# Patient Record
Sex: Female | Born: 1980 | Race: White | Hispanic: No | Marital: Married | Smoking: Never smoker
Health system: Southern US, Community
[De-identification: ages and names within clinical notes are randomized; demographics above are authoritative.]

## PROBLEM LIST (undated history)

## (undated) DIAGNOSIS — J45909 Unspecified asthma, uncomplicated: Secondary | ICD-10-CM

## (undated) DIAGNOSIS — R87629 Unspecified abnormal cytological findings in specimens from vagina: Secondary | ICD-10-CM

## (undated) DIAGNOSIS — F419 Anxiety disorder, unspecified: Secondary | ICD-10-CM

## (undated) DIAGNOSIS — K5090 Crohn's disease, unspecified, without complications: Secondary | ICD-10-CM

## (undated) DIAGNOSIS — K432 Incisional hernia without obstruction or gangrene: Secondary | ICD-10-CM

## (undated) DIAGNOSIS — L309 Dermatitis, unspecified: Secondary | ICD-10-CM

## (undated) HISTORY — PX: OSTOMY: SHX5997

## (undated) HISTORY — PX: HERNIA REPAIR: SHX51

---

## 1999-04-04 HISTORY — PX: SUBTOTAL COLECTOMY: SHX855

## 2010-04-24 ENCOUNTER — Encounter: Payer: Self-pay | Admitting: Gastroenterology

## 2010-05-04 ENCOUNTER — Other Ambulatory Visit (HOSPITAL_COMMUNITY)
Admission: RE | Admit: 2010-05-04 | Discharge: 2010-05-04 | Disposition: A | Discharge status: Home / Self Care | Payer: BC Managed Care – HMO | Source: Ambulatory Visit | Attending: Family Medicine | Admitting: Family Medicine

## 2010-05-04 DIAGNOSIS — Z0000 Encounter for general adult medical examination without abnormal findings: Secondary | ICD-10-CM | POA: Insufficient documentation

## 2012-02-12 ENCOUNTER — Observation Stay (HOSPITAL_BASED_OUTPATIENT_CLINIC_OR_DEPARTMENT_OTHER)
Admission: EM | Admit: 2012-02-12 | Discharge: 2012-02-15 | Disposition: A | Discharge status: Home / Self Care | Payer: BC Managed Care – PPO | Attending: General Surgery | Admitting: General Surgery

## 2012-02-12 ENCOUNTER — Emergency Department (HOSPITAL_BASED_OUTPATIENT_CLINIC_OR_DEPARTMENT_OTHER): Payer: BC Managed Care – PPO

## 2012-02-12 ENCOUNTER — Encounter (HOSPITAL_BASED_OUTPATIENT_CLINIC_OR_DEPARTMENT_OTHER): Payer: Self-pay | Admitting: Family Medicine

## 2012-02-12 DIAGNOSIS — K430 Incisional hernia with obstruction, without gangrene: Principal | ICD-10-CM | POA: Insufficient documentation

## 2012-02-12 DIAGNOSIS — K460 Unspecified abdominal hernia with obstruction, without gangrene: Secondary | ICD-10-CM

## 2012-02-12 DIAGNOSIS — Z9049 Acquired absence of other specified parts of digestive tract: Secondary | ICD-10-CM | POA: Insufficient documentation

## 2012-02-12 DIAGNOSIS — K5010 Crohn's disease of large intestine without complications: Secondary | ICD-10-CM | POA: Diagnosis present

## 2012-02-12 DIAGNOSIS — K432 Incisional hernia without obstruction or gangrene: Secondary | ICD-10-CM | POA: Diagnosis present

## 2012-02-12 DIAGNOSIS — Z932 Ileostomy status: Secondary | ICD-10-CM

## 2012-02-12 DIAGNOSIS — N808 Other endometriosis: Secondary | ICD-10-CM | POA: Insufficient documentation

## 2012-02-12 DIAGNOSIS — Z5331 Laparoscopic surgical procedure converted to open procedure: Secondary | ICD-10-CM | POA: Insufficient documentation

## 2012-02-12 HISTORY — DX: Anxiety disorder, unspecified: F41.9

## 2012-02-12 HISTORY — DX: Crohn's disease, unspecified, without complications: K50.90

## 2012-02-12 LAB — BASIC METABOLIC PANEL
BUN: 9 mg/dL (ref 6–23)
CO2: 28 mEq/L (ref 19–32)
Calcium: 9.8 mg/dL (ref 8.4–10.5)
Glucose, Bld: 88 mg/dL (ref 70–99)
Sodium: 137 mEq/L (ref 135–145)

## 2012-02-12 LAB — CBC WITH DIFFERENTIAL/PLATELET
Eosinophils Relative: 5 % (ref 0–5)
HCT: 41 % (ref 36.0–46.0)
Hemoglobin: 14.1 g/dL (ref 12.0–15.0)
Lymphocytes Relative: 20 % (ref 12–46)
Lymphs Abs: 2.2 10*3/uL (ref 0.7–4.0)
MCH: 29.1 pg (ref 26.0–34.0)
MCV: 84.7 fL (ref 78.0–100.0)
Monocytes Relative: 7 % (ref 3–12)
Platelets: 326 10*3/uL (ref 150–400)
RBC: 4.84 MIL/uL (ref 3.87–5.11)
WBC: 10.7 10*3/uL — ABNORMAL HIGH (ref 4.0–10.5)

## 2012-02-12 MED ORDER — IOHEXOL 300 MG/ML  SOLN
100.0000 mL | Freq: Once | INTRAMUSCULAR | Status: AC | PRN
  Administered 2012-02-12: 100 mL via INTRAVENOUS

## 2012-02-12 MED ORDER — IOHEXOL 300 MG/ML  SOLN: 25.0000 mL | INTRAMUSCULAR | Status: AC

## 2012-02-12 NOTE — ED Provider Notes (Signed)
History  This chart was scribed for Rolan Bucco, MD by Ardeen Jourdain, ED Scribe. This patient was seen in room MH12/MH12 and the patient's care was started at 1948.  CSN: [id number]  Arrival date & time 02/12/12  1749   First MD Initiated Contact with Patient 02/12/12 1948      Chief Complaint  Patient presents with  . Hernia     The history is provided by the patient. No language interpreter was used.    Sandra Love is a 31 y.o. female who presents to the Emergency Department complaining of a hernia to her RLQ with associated gradually worsening pain and warmth in the area. She denies fever, emesis, changes in appetite, diarrhea, constipation or the chance of pregnancy. She reports that the symptoms started a month ago.  She states that she saw her PCP who could not push the hernia back in and reccommended she get a CT.She has a hx of Crohn's dz s/p colon resection and ostomy placement.  She subsequently developed an incisional hernia which has been repaired twice to this area.  She has had the hernia recur, but is usually able to reduce it on her own.  Over the last 4 weeks, has had the hernia in the same place, but has not been able to reduce it.  Says that it has been tender to the area, is mildly increasing.   Past Medical History  Diagnosis Date  . Crohn disease     Past Surgical History  Procedure Date  . Hernia repair   . Colon resection   . Colon surgery     No family history on file.  History  Substance Use Topics  . Smoking status: Never Smoker   . Smokeless tobacco: Not on file  . Alcohol Use: Yes   No OB history available.   Review of Systems  Constitutional: Negative for fever, chills, diaphoresis and fatigue.  HENT: Negative for congestion, rhinorrhea and sneezing.   Eyes: Negative.   Respiratory: Negative for cough, chest tightness and shortness of breath.   Cardiovascular: Negative for chest pain and leg swelling.  Gastrointestinal: Positive for  abdominal pain. Negative for nausea, vomiting, diarrhea and blood in stool.  Genitourinary: Negative for frequency, hematuria, flank pain and difficulty urinating.  Musculoskeletal: Negative for back pain and arthralgias.  Skin: Negative for rash.  Neurological: Negative for dizziness, speech difficulty, weakness, numbness and headaches.  All other systems reviewed and are negative.    Allergies  Penicillins  Home Medications   Current Outpatient Rx  Name  Route  Sig  Dispense  Refill  . ALLEGRA PO   Oral   Take by mouth.           Triage Vitals: BP 119/71  Pulse 76  Temp 98.3 F (36.8 C) (Oral)  Resp 16  Ht 5' 3.5" (1.613 m)  Wt 128 lb (58.06 kg)  BMI 22.32 kg/m2  SpO2 100%  LMP 02/01/2012  Physical Exam  Nursing note and vitals reviewed. Constitutional: She is oriented to person, place, and time. She appears well-developed and well-nourished.  HENT:  Head: Normocephalic and atraumatic.  Eyes: Pupils are equal, round, and reactive to light.  Neck: Normal range of motion. Neck supple.  Cardiovascular: Normal rate, regular rhythm and normal heart sounds.   Pulmonary/Chest: Effort normal and breath sounds normal. No respiratory distress. She has no wheezes. She has no rales. She exhibits no tenderness.  Abdominal: Soft. Bowel sounds are normal. There is tenderness. There  is no rebound and no guarding.       Pt has a scar to right lower abdomen with a firm/tender mass about 2cm in diameter.  No surrounding abdominal tenderness.  +colostomy to left abdomen.  Musculoskeletal: Normal range of motion. She exhibits no edema.  Lymphadenopathy:    She has no cervical adenopathy.  Neurological: She is alert and oriented to person, place, and time.  Skin: Skin is warm and dry. No rash noted.  Psychiatric: She has a normal mood and affect.    ED Course  Procedures (including critical care time)  DIAGNOSTIC STUDIES: Oxygen Saturation is 100% on room air, normal by my  interpretation.    COORDINATION OF CARE:  7:57 PM: Discussed treatment plan which includes consultation with the GI surgeon with pt at bedside and pt agreed to plan.   Results for orders placed during the hospital encounter of 02/12/12  CBC WITH DIFFERENTIAL      Component Value Range   WBC 10.7 (*) 4.0 - 10.5 K/uL   RBC 4.84  3.87 - 5.11 MIL/uL   Hemoglobin 14.1  12.0 - 15.0 g/dL   HCT 65.4  65.0 - 35.4 %   MCV 84.7  78.0 - 100.0 fL   MCH 29.1  26.0 - 34.0 pg   MCHC 34.4  30.0 - 36.0 g/dL   RDW 65.6  81.2 - 75.1 %   Platelets 326  150 - 400 K/uL   Neutrophils Relative 68  43 - 77 %   Neutro Abs 7.3  1.7 - 7.7 K/uL   Lymphocytes Relative 20  12 - 46 %   Lymphs Abs 2.2  0.7 - 4.0 K/uL   Monocytes Relative 7  3 - 12 %   Monocytes Absolute 0.7  0.1 - 1.0 K/uL   Eosinophils Relative 5  0 - 5 %   Eosinophils Absolute 0.5  0.0 - 0.7 K/uL   Basophils Relative 0  0 - 1 %   Basophils Absolute 0.0  0.0 - 0.1 K/uL  BASIC METABOLIC PANEL      Component Value Range   Sodium 137  135 - 145 mEq/L   Potassium 3.9  3.5 - 5.1 mEq/L   Chloride 99  96 - 112 mEq/L   CO2 28  19 - 32 mEq/L   Glucose, Bld 88  70 - 99 mg/dL   BUN 9  6 - 23 mg/dL   Creatinine, Ser 7.00  0.50 - 1.10 mg/dL   Calcium 9.8  8.4 - 17.4 mg/dL   GFR calc non Af Amer >90  >90 mL/min   GFR calc Af Amer >90  >90 mL/min   Ct Abdomen Pelvis W Contrast  02/12/2012  *RADIOLOGY REPORT*  Clinical Data: Possible right inguinal hernia.  CT ABDOMEN AND PELVIS WITH CONTRAST  Technique:  Multidetector CT imaging of the abdomen and pelvis was performed following the standard protocol during bolus administration of intravenous contrast.  Contrast: OMNIPAQUE IOHEXOL 300 MG/ML  SOLN  Comparison: None.  Findings: No confluent opacities in the lung bases.  Heart is normal size.  No effusions.  Multiple gallstones within the gallbladder.  No biliary ductal dilatation.  No focal abnormality within the liver, spleen, pancreas, adrenals or  kidneys.  There is wall thickening within the rectum suggests the possibility of proctitis.  Recommend clinical correlation.  Uterus is retroverted, grossly unremarkable.  No adnexal masses or free fluid.  There is a left lower quadrant ostomy.  Prior subtotal colectomy.  There is a surgical scar noted in the right lower pelvic wall. There is a small hernia associated with this scar.  Question old stoma. This contains a small bowel loop.  No evidence of bowel obstruction.  No inguinal hernia.  Small bowel is decompressed and grossly unremarkable.  No acute bony abnormality.  IMPRESSION: Changes of prior subtotal colectomy.  The remaining rectum appears thick-walled, question proctitis.  Small hernia at the base scar in the right lower pelvic wall, likely had a prior ostomy site.  This contains a small bowel loop. No evidence of bowel obstruction.  Cholelithiasis.   Original Report Authenticated By: Charlett Nose, M.D.       1. Incarcerated hernia       MDM  On initial evaluation, pt with incisional hernia that has been present for 4 weeks.  I attempted to reduce, but was unable.  Pt was markedly tender with reduction attempts.  Is otherwise well appearing.  No vomiting, no fever.  I discussed with Dr Michaell Cowing who recommended going on with CT scan which was performed.  This showed a small loop of bowel in the incisional hernia.  I discussed again with Dr Michaell Cowing who recommended pt be transferred for surgical evaluation.  Discussed with Dr Cyndie Mull.  Pt denies need for pain meds.      I personally performed the services described in this documentation, which was scribed in my presence.  The recorded information has been reviewed and considered.    Rolan Bucco, MD 02/12/12 [phone]

## 2012-02-12 NOTE — ED Notes (Signed)
Pt. Aware of what the plan of care is.

## 2012-02-12 NOTE — ED Notes (Signed)
Pt sts she went to PCP today for incarcerated hermia to RLQ and PCP was unable to "push it in". Pt here for RLQ abdominal pain and sts PCP advised getting a CT.

## 2012-02-12 NOTE — ED Notes (Signed)
MD at bedside. 

## 2012-02-12 NOTE — ED Notes (Signed)
Family at bedside. 

## 2012-02-12 NOTE — ED Notes (Signed)
Pt. Reports she has an illiostomy since age 31 due to colon removed.

## 2012-02-13 ENCOUNTER — Observation Stay (HOSPITAL_COMMUNITY): Payer: BC Managed Care – PPO | Admitting: Anesthesiology

## 2012-02-13 ENCOUNTER — Encounter (HOSPITAL_COMMUNITY): Payer: Self-pay | Admitting: Surgery

## 2012-02-13 ENCOUNTER — Encounter (HOSPITAL_COMMUNITY): Admission: EM | Disposition: A | Discharge status: Home / Self Care | Payer: Self-pay | Source: Home / Self Care

## 2012-02-13 ENCOUNTER — Encounter (HOSPITAL_COMMUNITY): Payer: Self-pay | Admitting: Anesthesiology

## 2012-02-13 DIAGNOSIS — K432 Incisional hernia without obstruction or gangrene: Secondary | ICD-10-CM | POA: Diagnosis present

## 2012-02-13 DIAGNOSIS — Z932 Ileostomy status: Secondary | ICD-10-CM

## 2012-02-13 DIAGNOSIS — K5010 Crohn's disease of large intestine without complications: Secondary | ICD-10-CM | POA: Diagnosis present

## 2012-02-13 DIAGNOSIS — F419 Anxiety disorder, unspecified: Secondary | ICD-10-CM | POA: Insufficient documentation

## 2012-02-13 HISTORY — PX: LAPAROSCOPY: SHX197

## 2012-02-13 HISTORY — PX: INCISIONAL HERNIA REPAIR: SHX193

## 2012-02-13 HISTORY — PX: INSERTION OF MESH: SHX5868

## 2012-02-13 HISTORY — DX: Incisional hernia without obstruction or gangrene: K43.2

## 2012-02-13 SURGERY — INSERTION OF MESH
Anesthesia: General | Site: Abdomen | Wound class: Clean

## 2012-02-13 MED ORDER — HYDROMORPHONE HCL PF 1 MG/ML IJ SOLN
0.5000 mg | INTRAMUSCULAR | Status: DC | PRN
  Administered 2012-02-13 – 2012-02-15 (×12): 1 mg via INTRAVENOUS
  Filled 2012-02-13 (×12): qty 1

## 2012-02-13 MED ORDER — LORAZEPAM 2 MG/ML IJ SOLN: 0.5000 mg | Freq: Three times a day (TID) | INTRAMUSCULAR | Status: DC | PRN

## 2012-02-13 MED ORDER — OXYCODONE HCL 5 MG PO TABS
5.0000 mg | ORAL_TABLET | ORAL | Status: DC | PRN
  Administered 2012-02-14 – 2012-02-15 (×3): 5 mg via ORAL
  Administered 2012-02-15: 10 mg via ORAL
  Filled 2012-02-13 (×2): qty 1
  Filled 2012-02-13: qty 2
  Filled 2012-02-13: qty 1

## 2012-02-13 MED ORDER — FENTANYL CITRATE 0.05 MG/ML IJ SOLN
INTRAMUSCULAR | Status: DC | PRN
  Administered 2012-02-13 (×5): 50 ug via INTRAVENOUS

## 2012-02-13 MED ORDER — CISATRACURIUM BESYLATE (PF) 10 MG/5ML IV SOLN
INTRAVENOUS | Status: DC | PRN
  Administered 2012-02-13: 5 mg via INTRAVENOUS
  Administered 2012-02-13 (×3): 1 mg via INTRAVENOUS

## 2012-02-13 MED ORDER — PHENOL 1.4 % MT LIQD: 2.0000 | OROMUCOSAL | Status: DC | PRN

## 2012-02-13 MED ORDER — LACTATED RINGERS IR SOLN
Status: DC | PRN
  Administered 2012-02-13: 3000 mL

## 2012-02-13 MED ORDER — GENTAMICIN SULFATE 40 MG/ML IJ SOLN
280.0000 mg | Freq: Once | INTRAVENOUS | Status: DC
  Filled 2012-02-13: qty 7

## 2012-02-13 MED ORDER — GENTAMICIN IN SALINE 1-0.9 MG/ML-% IV SOLN: 100.0000 mg | INTRAVENOUS | Status: DC

## 2012-02-13 MED ORDER — ACETAMINOPHEN 10 MG/ML IV SOLN
INTRAVENOUS | Status: DC | PRN
  Administered 2012-02-13: 1000 mg via INTRAVENOUS

## 2012-02-13 MED ORDER — CLINDAMYCIN PHOSPHATE 600 MG/50ML IV SOLN
600.0000 mg | INTRAVENOUS | Status: AC
  Administered 2012-02-13: 600 mg via INTRAVENOUS
  Filled 2012-02-13: qty 50

## 2012-02-13 MED ORDER — ALUM & MAG HYDROXIDE-SIMETH 200-200-20 MG/5ML PO SUSP: 30.0000 mL | Freq: Four times a day (QID) | ORAL | Status: DC | PRN

## 2012-02-13 MED ORDER — BUPIVACAINE-EPINEPHRINE 0.25% -1:200000 IJ SOLN
INTRAMUSCULAR | Status: DC | PRN
  Administered 2012-02-13: 50 mL

## 2012-02-13 MED ORDER — ACETAMINOPHEN 500 MG PO TABS
1000.0000 mg | ORAL_TABLET | Freq: Three times a day (TID) | ORAL | Status: DC
  Administered 2012-02-13 – 2012-02-15 (×8): 1000 mg via ORAL
  Filled 2012-02-13 (×11): qty 2

## 2012-02-13 MED ORDER — ACETAMINOPHEN 650 MG RE SUPP: 650.0000 mg | Freq: Four times a day (QID) | RECTAL | Status: DC | PRN

## 2012-02-13 MED ORDER — DEXTROSE IN LACTATED RINGERS 5 % IV SOLN
INTRAVENOUS | Status: DC
  Administered 2012-02-13: 17:00:00 via INTRAVENOUS
  Administered 2012-02-13: 1000 mL via INTRAVENOUS
  Administered 2012-02-14: 03:00:00 via INTRAVENOUS

## 2012-02-13 MED ORDER — GLYCOPYRROLATE 0.2 MG/ML IJ SOLN
INTRAMUSCULAR | Status: DC | PRN
  Administered 2012-02-13: 0.2 mg via INTRAVENOUS
  Administered 2012-02-13: 0.6 mg via INTRAVENOUS

## 2012-02-13 MED ORDER — DIPHENHYDRAMINE HCL 50 MG/ML IJ SOLN: 12.5000 mg | Freq: Four times a day (QID) | INTRAMUSCULAR | Status: DC | PRN

## 2012-02-13 MED ORDER — DEXAMETHASONE SODIUM PHOSPHATE 10 MG/ML IJ SOLN
INTRAMUSCULAR | Status: DC | PRN
  Administered 2012-02-13: 10 mg via INTRAVENOUS

## 2012-02-13 MED ORDER — LORATADINE 10 MG PO TABS
10.0000 mg | ORAL_TABLET | Freq: Every day | ORAL | Status: DC
  Administered 2012-02-14 – 2012-02-15 (×2): 10 mg via ORAL
  Filled 2012-02-13 (×3): qty 1

## 2012-02-13 MED ORDER — PROMETHAZINE HCL 25 MG/ML IJ SOLN: 6.2500 mg | INTRAMUSCULAR | Status: DC | PRN

## 2012-02-13 MED ORDER — MAGIC MOUTHWASH
15.0000 mL | Freq: Four times a day (QID) | ORAL | Status: DC | PRN
  Filled 2012-02-13: qty 15

## 2012-02-13 MED ORDER — LIDOCAINE HCL (CARDIAC) 20 MG/ML IV SOLN
INTRAVENOUS | Status: DC | PRN
  Administered 2012-02-13: 30 mg via INTRAVENOUS

## 2012-02-13 MED ORDER — LACTATED RINGERS IV SOLN
INTRAVENOUS | Status: DC | PRN
  Administered 2012-02-13: 05:00:00 via INTRAVENOUS

## 2012-02-13 MED ORDER — SUCCINYLCHOLINE CHLORIDE 20 MG/ML IJ SOLN
INTRAMUSCULAR | Status: DC | PRN
  Administered 2012-02-13: 100 mg via INTRAVENOUS

## 2012-02-13 MED ORDER — HYDROMORPHONE HCL PF 1 MG/ML IJ SOLN
0.2500 mg | INTRAMUSCULAR | Status: DC | PRN
  Administered 2012-02-13 (×4): 0.5 mg via INTRAVENOUS

## 2012-02-13 MED ORDER — PROMETHAZINE HCL 25 MG/ML IJ SOLN: 12.5000 mg | Freq: Four times a day (QID) | INTRAMUSCULAR | Status: DC | PRN

## 2012-02-13 MED ORDER — MEPERIDINE HCL 50 MG/ML IJ SOLN: 6.2500 mg | INTRAMUSCULAR | Status: DC | PRN

## 2012-02-13 MED ORDER — OXYCODONE HCL 5 MG PO TABS: 5.0000 mg | ORAL_TABLET | Freq: Once | ORAL | Status: DC | PRN

## 2012-02-13 MED ORDER — DIPHENHYDRAMINE HCL 12.5 MG/5ML PO ELIX: 12.5000 mg | ORAL_SOLUTION | Freq: Four times a day (QID) | ORAL | Status: DC | PRN

## 2012-02-13 MED ORDER — BUPIVACAINE-EPINEPHRINE 0.25% -1:200000 IJ SOLN
INTRAMUSCULAR | Status: AC
  Filled 2012-02-13: qty 1

## 2012-02-13 MED ORDER — ONDANSETRON HCL 4 MG/2ML IJ SOLN
INTRAMUSCULAR | Status: DC | PRN
  Administered 2012-02-13: 4 mg via INTRAVENOUS

## 2012-02-13 MED ORDER — HYDROMORPHONE HCL PF 1 MG/ML IJ SOLN
INTRAMUSCULAR | Status: AC
  Administered 2012-02-13: 1 mg via INTRAVENOUS
  Filled 2012-02-13: qty 1

## 2012-02-13 MED ORDER — PROPOFOL 10 MG/ML IV EMUL
INTRAVENOUS | Status: DC | PRN
  Administered 2012-02-13: 150 mg via INTRAVENOUS

## 2012-02-13 MED ORDER — MIDAZOLAM HCL 5 MG/5ML IJ SOLN
INTRAMUSCULAR | Status: DC | PRN
  Administered 2012-02-13: 0.5 mg via INTRAVENOUS

## 2012-02-13 MED ORDER — NEOSTIGMINE METHYLSULFATE 1 MG/ML IJ SOLN
INTRAMUSCULAR | Status: DC | PRN
  Administered 2012-02-13: 4 mg via INTRAVENOUS

## 2012-02-13 MED ORDER — ACETAMINOPHEN 10 MG/ML IV SOLN: 1000.0000 mg | Freq: Once | INTRAVENOUS | Status: DC | PRN

## 2012-02-13 MED ORDER — HYDROMORPHONE HCL PF 1 MG/ML IJ SOLN
INTRAMUSCULAR | Status: DC | PRN
  Administered 2012-02-13: 0.5 mg via INTRAVENOUS

## 2012-02-13 MED ORDER — HYDROMORPHONE HCL PF 1 MG/ML IJ SOLN
INTRAMUSCULAR | Status: AC
  Administered 2012-02-14: 1 mg via INTRAVENOUS
  Filled 2012-02-13: qty 1

## 2012-02-13 MED ORDER — ACETAMINOPHEN 325 MG PO TABS: 650.0000 mg | ORAL_TABLET | Freq: Four times a day (QID) | ORAL | Status: DC | PRN

## 2012-02-13 MED ORDER — OXYCODONE HCL 5 MG/5ML PO SOLN
5.0000 mg | Freq: Once | ORAL | Status: DC | PRN
  Filled 2012-02-13: qty 5

## 2012-02-13 MED ORDER — LIP MEDEX EX OINT
1.0000 "application " | TOPICAL_OINTMENT | Freq: Two times a day (BID) | CUTANEOUS | Status: DC
  Administered 2012-02-13 – 2012-02-15 (×4): 1 via TOPICAL
  Filled 2012-02-13: qty 7

## 2012-02-13 MED ORDER — ONDANSETRON HCL 4 MG/2ML IJ SOLN: 4.0000 mg | Freq: Four times a day (QID) | INTRAMUSCULAR | Status: DC | PRN

## 2012-02-13 MED ORDER — LACTATED RINGERS IV BOLUS (SEPSIS)
1000.0000 mL | Freq: Three times a day (TID) | INTRAVENOUS | Status: AC | PRN
  Administered 2012-02-13: 1000 mL via INTRAVENOUS

## 2012-02-13 MED ORDER — GENTAMICIN SULFATE 40 MG/ML IJ SOLN
290.5000 mg | INTRAVENOUS | Status: DC | PRN
  Administered 2012-02-13: 290.5 mg via INTRAVENOUS

## 2012-02-13 MED ORDER — LACTATED RINGERS IV SOLN
INTRAVENOUS | Status: DC | PRN
  Administered 2012-02-13 (×2): via INTRAVENOUS

## 2012-02-13 MED ORDER — LORAZEPAM BOLUS VIA INFUSION: 0.5000 mg | Freq: Three times a day (TID) | INTRAVENOUS | Status: DC | PRN

## 2012-02-13 SURGICAL SUPPLY — 57 items
APPLIER CLIP 5 13 M/L LIGAMAX5 (MISCELLANEOUS)
BINDER ABD UNIV 12 45-62 (WOUND CARE) IMPLANT
BINDER ABDOMINAL 46IN 62IN (WOUND CARE)
CANISTER SUCTION 2500CC (MISCELLANEOUS) ×3 IMPLANT
CATH KIT ON-Q SILVERSOAK 7.5IN (CATHETERS) IMPLANT
CLIP APPLIE 5 13 M/L LIGAMAX5 (MISCELLANEOUS) IMPLANT
CLOTH BEACON ORANGE TIMEOUT ST (SAFETY) ×3 IMPLANT
DECANTER SPIKE VIAL GLASS SM (MISCELLANEOUS) ×3 IMPLANT
DEVICE SECURE STRAP 25 ABSORB (INSTRUMENTS) ×3 IMPLANT
DEVICE TROCAR PUNCTURE CLOSURE (ENDOMECHANICALS) ×3 IMPLANT
DRAPE LAPAROSCOPIC ABDOMINAL (DRAPES) ×3 IMPLANT
DRAPE UTILITY 15X26 (DRAPE) ×3 IMPLANT
DRAPE WARM FLUID 44X44 (DRAPE) ×3 IMPLANT
DRSG TEGADERM 2-3/8X2-3/4 SM (GAUZE/BANDAGES/DRESSINGS) ×9 IMPLANT
DRSG TEGADERM 4X4.75 (GAUZE/BANDAGES/DRESSINGS) ×6 IMPLANT
ELECT REM PT RETURN 9FT ADLT (ELECTROSURGICAL) ×3
ELECTRODE REM PT RTRN 9FT ADLT (ELECTROSURGICAL) ×2 IMPLANT
EVACUATOR SILICONE 100CC (DRAIN) ×3 IMPLANT
FILTER SMOKE EVAC LAPAROSHD (FILTER) IMPLANT
GAUZE SPONGE 2X2 8PLY STRL LF (GAUZE/BANDAGES/DRESSINGS) ×2 IMPLANT
GLOVE ECLIPSE 8.0 STRL XLNG CF (GLOVE) ×3 IMPLANT
GLOVE INDICATOR 8.0 STRL GRN (GLOVE) ×6 IMPLANT
GOWN STRL NON-REIN LRG LVL3 (GOWN DISPOSABLE) ×3 IMPLANT
GOWN STRL REIN XL XLG (GOWN DISPOSABLE) ×6 IMPLANT
HAND ACTIVATED (MISCELLANEOUS) IMPLANT
IV LACTATED RINGER IRRG 3000ML (IV SOLUTION) ×1
IV LR IRRIG 3000ML ARTHROMATIC (IV SOLUTION) ×2 IMPLANT
KIT BASIN OR (CUSTOM PROCEDURE TRAY) ×3 IMPLANT
MESH PHYSIO OVAL 15X20CM (Mesh General) ×3 IMPLANT
NEEDLE SPNL 22GX3.5 QUINCKE BK (NEEDLE) IMPLANT
NS IRRIG 1000ML POUR BTL (IV SOLUTION) ×3 IMPLANT
PEN SKIN MARKING BROAD (MISCELLANEOUS) ×3 IMPLANT
PENCIL BUTTON HOLSTER BLD 10FT (ELECTRODE) ×3 IMPLANT
SCISSORS LAP 5X35 DISP (ENDOMECHANICALS) ×3 IMPLANT
SET IRRIG TUBING LAPAROSCOPIC (IRRIGATION / IRRIGATOR) IMPLANT
SLEEVE Z-THREAD 5X100MM (TROCAR) ×6 IMPLANT
SPONGE GAUZE 2X2 STER 10/PKG (GAUZE/BANDAGES/DRESSINGS) ×1
SPONGE LAP 18X18 X RAY DECT (DISPOSABLE) ×3 IMPLANT
STRIP CLOSURE SKIN 1/2X4 (GAUZE/BANDAGES/DRESSINGS) ×3 IMPLANT
SUT ETHILON 2 0 PS N (SUTURE) ×3 IMPLANT
SUT MNCRL AB 4-0 PS2 18 (SUTURE) ×6 IMPLANT
SUT PDS AB 1 CT1 27 (SUTURE) ×12 IMPLANT
SUT PROLENE 0 CT 1 CR/8 (SUTURE) ×3 IMPLANT
SUT PROLENE 1 CT 1 30 (SUTURE) IMPLANT
SUT VIC AB 2-0 SH 27 (SUTURE) ×1
SUT VIC AB 2-0 SH 27X BRD (SUTURE) ×2 IMPLANT
SUT VIC AB 2-0 UR6 27 (SUTURE) IMPLANT
SUT VIC AB 3-0 SH 18 (SUTURE) ×6 IMPLANT
TOWEL OR 17X26 10 PK STRL BLUE (TOWEL DISPOSABLE) ×3 IMPLANT
TRAY FOLEY CATH 14FRSI W/METER (CATHETERS) ×3 IMPLANT
TRAY LAP CHOLE (CUSTOM PROCEDURE TRAY) ×3 IMPLANT
TROCAR XCEL BLADELESS 5X75MML (TROCAR) ×3 IMPLANT
TROCAR Z-THREAD FIOS 11X100 BL (TROCAR) ×3 IMPLANT
TROCAR Z-THREAD FIOS 5X100MM (TROCAR) ×3 IMPLANT
TUBING INSUFFLATION 10FT LAP (TUBING) ×3 IMPLANT
TUNNELER SHEATH ON-Q 16GX12 DP (PAIN MANAGEMENT) IMPLANT
YANKAUER SUCT BULB TIP 10FT TU (MISCELLANEOUS) ×3 IMPLANT

## 2012-02-13 NOTE — Progress Notes (Signed)
Sandra Love  05-Nov-1980 [id number]   This patient is a 31 y.o.female who presents today for surgical evaluation at the request of Dr. Fredderick Phenix, [facility] ED.   Reason for evaluation: Painful lump right lower quadrant.  Probable incarcerated hernia.  Pleasant young female with a history of Crohn's disease.  Required to colectomy is adding up to and abdominal colectomy.  As the rectal stump and end ileostomy.  Initially ileostomy was in right lower quadrant.  He was later relocated to the left side.  Parastomal hernia on the right repair primarily.  She has not had any surgeries since 2001.  Not followed by gastroenterology for her Crohn's disease.  Normally empties her bag 3-4 times a day.  No antidiarrheals.  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  The lump now bothers her when she tries to move around or if she stands for long periods of time at work.  A few weeks ago she noticed that the reducible hernia in her right lower quadrant became stuck.  It is common larger.  It is sensitive to touch.  She cannot reduce it anymore.  She came emergency room.  It could not be reduced there.  They obtained a CT scan per request of the patient's primary care physician.  It does show a section of small bowel up in the hernia.  I am asked to evaluate.  No personal nor family history of GI/colon cancer, irritable bowel syndrome, allergy such as Celiac Sprue, dietary/dairy problems, colitis, ulcers nor gastritis.  No recent sick contacts/gastroenteritis.  No travel outside the country.  No changes in diet.    Past Medical History  Diagnosis Date  . Crohn disease     Past Surgical History  Procedure Date  . Hernia repair   . Colon resection   . Colon surgery     History   Social History  . Marital Status: Single    Spouse Name: N/A    Number of Children: N/A  . Years of Education: N/A   Occupational History  . Not on file.   Social History Main Topics  . Smoking status: Never Smoker   .  Smokeless tobacco: Not on file  . Alcohol Use: Yes  . Drug Use: No  . Sexually Active:    Other Topics Concern  . Not on file   Social History Narrative  . No narrative on file    No family history on file.  Current Facility-Administered Medications  Medication Dose Route Frequency Provider Last Rate Last Dose  . acetaminophen (TYLENOL) tablet 650 mg  650 mg Oral Q6H PRN Ardeth Sportsman, MD       Or  . acetaminophen (TYLENOL) suppository 650 mg  650 mg Rectal Q6H PRN Ardeth Sportsman, MD      . alum & mag hydroxide-simeth (MAALOX/MYLANTA) 200-200-20 MG/5ML suspension 30 mL  30 mL Oral Q6H PRN Ardeth Sportsman, MD      . clindamycin (CLEOCIN) IVPB 600 mg  600 mg Intravenous On Call to OR Ardeth Sportsman, MD      . dextrose 5 % in lactated ringers infusion   Intravenous Continuous Ardeth Sportsman, MD      . diphenhydrAMINE (BENADRYL) injection 12.5-25 mg  12.5-25 mg Intravenous Q6H PRN Ardeth Sportsman, MD       Or  . diphenhydrAMINE (BENADRYL) 12.5 MG/5ML elixir 12.5-25 mg  12.5-25 mg Oral Q6H PRN Ardeth Sportsman, MD      .  gentamicin (GARAMYCIN) IVPB 100 mg  100 mg Intravenous On Call to OR Ardeth Sportsman, MD      . HYDROmorphone (DILAUDID) injection 0.5-2 mg  0.5-2 mg Intravenous Q2H PRN Ardeth Sportsman, MD      . [COMPLETED] iohexol (OMNIPAQUE) 300 MG/ML solution 100 mL  100 mL Intravenous Once PRN Md Ccs, MD   100 mL at 02/12/12 2140  . [EXPIRED] iohexol (OMNIPAQUE) 300 MG/ML solution 25 mL  25 mL Oral Q1 Hr x 2 Md Ccs, MD      . lactated ringers bolus 1,000 mL  1,000 mL Intravenous Q8H PRN Ardeth Sportsman, MD      . lip balm (CARMEX) ointment 1 application  1 application Topical BID Ardeth Sportsman, MD      . loratadine (CLARITIN) tablet 10 mg  10 mg Oral Daily Ardeth Sportsman, MD      . LORazepam (ATIVAN) bolus via infusion 0.5-1 mg  0.5-1 mg Intravenous Q8H PRN Ardeth Sportsman, MD      . magic mouthwash  15 mL Oral QID PRN Ardeth Sportsman, MD      . ondansetron (ZOFRAN)  injection 4 mg  4 mg Intravenous Q6H PRN Ardeth Sportsman, MD      . phenol (CHLORASEPTIC) mouth spray 2 spray  2 spray Mouth/Throat PRN Ardeth Sportsman, MD      . promethazine (PHENERGAN) injection 12.5-25 mg  12.5-25 mg Intravenous Q6H PRN Ardeth Sportsman, MD       Current Outpatient Prescriptions  Medication Sig Dispense Refill  . fexofenadine (ALLEGRA) 180 MG tablet Take 180 mg by mouth daily.         Allergies  Allergen Reactions  . Penicillins Hives    & fever    ROS: Constitutional:  No fevers, chills, sweats.  Weight stable Eyes:  No vision changes, No discharge HENT:  No sore throats, nasal drainage Lymph: No neck swelling, No bruising easily Pulmonary:  No cough, productive sputum CV: No orthopnea, PND  Patient walks 30 minutes for about 1.5 miles without difficulty.  No exertional chest/neck/shoulder/arm pain. GI: No personal nor family history of GI/colon cancer, UC, irritable bowel syndrome, allergy such as Celiac Sprue, dietary/dairy problems, colitis, ulcers nor gastritis.  No recent sick contacts/gastroenteritis.  No travel outside the country.  No changes in diet. Renal: No UTIs, No hematuria Genital:  No drainage, bleeding, masses Musculoskeletal: No severe joint pain.  Good ROM major joints Skin:  No sores or lesions.  No rashes Heme/Lymph:  No easy bleeding.  No swollen lymph nodes Neuro: No focal weakness/numbness.  No seizures Psych: No suicidal ideation.  No hallucinations  BP 104/46  Pulse 66  Temp 98.2 F (36.8 C) (Oral)  Resp 18  Ht 5' 3.5" (1.613 m)  Wt 128 lb (58.06 kg)  BMI 22.32 kg/m2  SpO2 99%  LMP 02/01/2012  Physical Exam: General: Pt awake/alert/oriented x4 in no major acute distress Eyes: PERRL, normal EOM. Sclera nonicteric Neuro: CN II-XII intact w/o focal sensory/motor deficits. Lymph: No head/neck/groin lymphadenopathy Psych:  No delerium/psychosis/paranoia.  Anxious/tearful at times but consolable HENT: Normocephalic, Mucus  membranes moist.  No thrush Neck: Supple, No tracheal deviation Chest: No pain.  Good respiratory excursion. CV:  Pulses intact.  Regular rhythm Abdomen: Soft, Nondistended.  Nontender except RLQ 3x2 cm tender mass over RLQ oblique incision/scar.  Not reducible.  Very sensitive to touch - she cannot tolerate attempts at reduction.  LLQ ileostomy in place +gas/effluent  stool Ext:  SCDs BLE.  No significant edema.  No cyanosis Skin: No petechiae / purpurea.  No major sores Musculoskeletal: No severe joint pain.  Good ROM major joints   Results:   Labs: Results for orders placed during the hospital encounter of 02/12/12 (from the past 48 hour(s))  CBC WITH DIFFERENTIAL     Status: Abnormal   Collection Time   02/12/12  8:26 PM      Component Value Range Comment   WBC 10.7 (*) 4.0 - 10.5 K/uL    RBC 4.84  3.87 - 5.11 MIL/uL    Hemoglobin 14.1  12.0 - 15.0 g/dL    HCT 16.1  09.6 - 04.5 %    MCV 84.7  78.0 - 100.0 fL    MCH 29.1  26.0 - 34.0 pg    MCHC 34.4  30.0 - 36.0 g/dL    RDW 40.9  81.1 - 91.4 %    Platelets 326  150 - 400 K/uL    Neutrophils Relative 68  43 - 77 %    Neutro Abs 7.3  1.7 - 7.7 K/uL    Lymphocytes Relative 20  12 - 46 %    Lymphs Abs 2.2  0.7 - 4.0 K/uL    Monocytes Relative 7  3 - 12 %    Monocytes Absolute 0.7  0.1 - 1.0 K/uL    Eosinophils Relative 5  0 - 5 %    Eosinophils Absolute 0.5  0.0 - 0.7 K/uL    Basophils Relative 0  0 - 1 %    Basophils Absolute 0.0  0.0 - 0.1 K/uL   BASIC METABOLIC PANEL     Status: Normal   Collection Time   02/12/12  8:26 PM      Component Value Range Comment   Sodium 137  135 - 145 mEq/L    Potassium 3.9  3.5 - 5.1 mEq/L    Chloride 99  96 - 112 mEq/L    CO2 28  19 - 32 mEq/L    Glucose, Bld 88  70 - 99 mg/dL    BUN 9  6 - 23 mg/dL    Creatinine, Ser 7.82  0.50 - 1.10 mg/dL    Calcium 9.8  8.4 - 95.6 mg/dL    GFR calc non Af Amer >90  >90 mL/min    GFR calc Af Amer >90  >90 mL/min     Imaging / Studies: Ct  Abdomen Pelvis W Contrast  02/12/2012  *RADIOLOGY REPORT*  Clinical Data: Possible right inguinal hernia.  CT ABDOMEN AND PELVIS WITH CONTRAST  Technique:  Multidetector CT imaging of the abdomen and pelvis was performed following the standard protocol during bolus administration of intravenous contrast.  Contrast: OMNIPAQUE IOHEXOL 300 MG/ML  SOLN  Comparison: None.  Findings: No confluent opacities in the lung bases.  Heart is normal size.  No effusions.  Multiple gallstones within the gallbladder.  No biliary ductal dilatation.  No focal abnormality within the liver, spleen, pancreas, adrenals or kidneys.  There is wall thickening within the rectum suggests the possibility of proctitis.  Recommend clinical correlation.  Uterus is retroverted, grossly unremarkable.  No adnexal masses or free fluid.  There is a left lower quadrant ostomy.  Prior subtotal colectomy.  There is a surgical scar noted in the right lower pelvic wall. There is a small hernia associated with this scar.  Question old stoma. This contains a small bowel loop.  No evidence of bowel  obstruction.  No inguinal hernia.  Small bowel is decompressed and grossly unremarkable.  No acute bony abnormality.  IMPRESSION: Changes of prior subtotal colectomy.  The remaining rectum appears thick-walled, question proctitis.  Small hernia at the base scar in the right lower pelvic wall, likely had a prior ostomy site.  This contains a small bowel loop. No evidence of bowel obstruction.  Cholelithiasis.   Original Report Authenticated By: Charlett Nose, M.D.     Medications / Allergies: per chart  Antibiotics: Anti-infectives     Start     Dose/Rate Route Frequency Ordered Stop   02/13/12 0600   gentamicin (GARAMYCIN) IVPB 100 mg     Comments: Pharmacy may adjust dosing strength, schedule, rate of infusion, etc as needed to optimize therapy       100 mg 200 mL/hr over 30 Minutes Intravenous On call to O.R. 02/13/12 0332 02/14/12 0559    02/13/12 0600   clindamycin (CLEOCIN) IVPB 600 mg        600 mg 100 mL/hr over 30 Minutes Intravenous On call to O.R. 02/13/12 0332 02/14/12 0559          Assessment  Thea Alken  31 y.o. female     Procedure(s): LAPAROSCOPIC VENTRAL HERNIA INSERTION OF MESH  Problem List:  Principal Problem:  *Crohn's disease of colon s/p abdominal colectomy / ileostomy Active Problems:  Ileostomy in LLQ  Recurrent RLQ incisional hernia with incarceration   Incarcerated recurrent right lower quadrant incisional hernia.  No evidence of obstruction but containing intestine & very sensitive with pain.  Plan:  I long discussion with the patient and her significant other.  Because she is having pain, this is larger, and it is not reducible; I recommended urgent surgery to reduce and repair the hernia.  Despite her numerous operations, it is reasonable start out laparoscopically to get an area mapped out.  Perhaps do a Tapp repair given how low this is.  Allows the area to relax and allow [place] to reduce the hernia for her.  The anatomy & physiology of the abdominal wall was discussed.  The pathophysiology of hernias was discussed.  Natural history risks without surgery including progeressive enlargement, pain, incarceration & strangulation was discussed.   Contributors to complications such as smoking, obesity, diabetes, prior surgery, etc were discussed.   I feel the risks of no intervention will lead to serious problems that outweigh the operative risks; therefore, I recommended surgery to reduce and repair the hernia.  I explained laparoscopic techniques with possible need for an open approach.  I noted the probable use of mesh to patch and/or buttress the hernia repair  Risks such as bleeding, infection, abscess, need for further treatment, heart attack, death, and other risks were discussed.  I noted a good likelihood this will help address the problem.   Goals of post-operative recovery were  discussed as well.  Possibility that this will not correct all symptoms was explained.  I stressed the importance of low-impact activity, aggressive pain control, avoiding constipation, & not pushing through pain to minimize risk of post-operative chronic pain or injury. Possibility of reherniation especially with smoking, obesity, diabetes, immunosuppression, and other health conditions was discussed.  We will work to minimize complications.     An educational handout further explaining the pathology & treatment options was given as well.  Questions were answered.  The patient expresses understanding & wishes to proceed with surgery.   She was hoping she could wait and schedule this at  a more convenient time.  With her knee having discomfort in the area larger, I do not think it is safe to wait.  I tried numerous times to see if she could tolerate getting it reduced.  She very anxious about it and sensitive at it.  It could not be tolerated.  I could not reduce it I stopped  Consider seeing a gastroenterologist about your Crohn disease for further followup.  Her husband is more inclined toward surgery.  She was initially very hesitant seems to comprehend the importance of proceeding now.  We are trying to treat & prevent a life-threatening emergency.  -VTE prophylaxis- SCDs, etc -mobilize as tolerated to help recovery    Ardeth Sportsman, M.D., F.A.C.S. Gastrointestinal and Minimally Invasive Surgery [facility] [address], [address] [place], [place] [address] [phone] Main / Paging [phone] Voice Mail   02/13/2012  CARE TEAM:  PCP: No primary provider on file.  Outpatient Care Team: Patient has no care team.  Inpatient Treatment Team: Treatment Team: Attending Provider: Md Montez Morita, MD; Registered Nurse: Anne Shutter, RN; Consulting Physician: Bishop Limbo, MD; Technician: Shade Flood, NT; Registered Nurse: Danella Maiers,  RN

## 2012-02-13 NOTE — Anesthesia Preprocedure Evaluation (Signed)
Anesthesia Evaluation  Patient identified by MRN, date of birth, ID band Patient awake    Reviewed: Allergy & Precautions, H&P , NPO status , Patient's Chart, lab work & pertinent test results  Airway       Dental   Pulmonary neg pulmonary ROS,          Cardiovascular - CAD and - Past MI negative cardio ROS      Neuro/Psych Anxiety negative neurological ROS     GI/Hepatic Neg liver ROS, Crohn's disease   Endo/Other  negative endocrine ROS  Renal/GU negative Renal ROS     Musculoskeletal negative musculoskeletal ROS (+)   Abdominal (+) - obese,   Peds  Hematology negative hematology ROS (+)   Anesthesia Other Findings   Reproductive/Obstetrics                           Anesthesia Physical Anesthesia Plan  ASA: II and emergent  Anesthesia Plan: General   Post-op Pain Management:    Induction: Intravenous and Rapid sequence  Airway Management Planned: Oral ETT  Additional Equipment:   Intra-op Plan:   Post-operative Plan: Extubation in OR  Informed Consent: I have reviewed the patients History and Physical, chart, labs and discussed the procedure including the risks, benefits and alternatives for the proposed anesthesia with the patient or authorized representative who has indicated his/her understanding and acceptance.   Dental advisory given  Plan Discussed with: CRNA  Anesthesia Plan Comments:         Anesthesia Quick Evaluation

## 2012-02-13 NOTE — Preoperative (Signed)
Beta Blockers   Reason not to administer Beta Blockers:Not Applicable 

## 2012-02-13 NOTE — Op Note (Signed)
NAME:  Sandra Love, Sandra Love          ACCOUNT NO.:  [id number]  MEDICAL RECORD NO.:  [id number]  LOCATION:  [facility]                         FACILITY:  [facility]  PHYSICIAN:  Ardeth Sportsman, MD     DATE OF BIRTH:  06-07-1980  DATE OF PROCEDURE:  02/13/2012 DATE OF DISCHARGE:                              OPERATIVE REPORT   PRIMARY CARE PHYSICIAN:  Laurann Montana, MD  GI: Vida Rigger, MD  REQUESTING PHYSICIAN:  Rolan Bucco, MD  PREOPERATIVE DIAGNOSIS:  Incarcerated recurrent right lower quadrant abdominal wall incisional hernia.  POSTOPERATIVE DIAGNOSES: 1. Recurrent right lower quadrant abdominal wall incisional hernia. 2. Abdominal wall mass and hernia sac?  endometrioma.  PROCEDURE PERFORMED: 1. Diagnostic laparoscopy. 2. Laparoscopic lysis of adhesions. 3. Abdominal wall exploration with excision of abdominal wall mass. 4. Primary right lower quadrant ventral incisional hernia repair. 5. Laparoscopically assisted underlay repair of ventral hernia repair     with mesh.  ANESTHESIA: 1. General anesthesia. 2. Local anesthetic in a field block around all port sites. 3. A field block around fascial closure.  SPECIMENS:  Abdominal wall mass, probable endometrioma.  DRAINS:  A 15-French Blake drain goes from the umbilicus into the subcutaneous tissues around the right lower quadrant closure.  ESTIMATED BLOOD LOSS:  50 mL.  COMPLICATIONS:  None major.  INDICATIONS:  Ms. Ameliagrace Codling is a 31 year old female with Crohn's.  She had numerous surgeries at age 31 at [facility].  She had partial colectomy with ilstomy, and a completion colectomy with relocation of ileostomy. She used to have an ileostomy in the right lower quadrant and then it was moved to the left lower quadrant.  She had had hernia recurrence repaired once at the RLQ old ileostomy site, and then had a reducible mass in the right lower quadrant suspicious for a ventral hernia.  She notes that couple  weeks ago the mass became more firm and was not reducible.  It has become more sensitive.  It is uncomfortable standing or bending over at work.  She was concerned.  She went and saw her primary care physician.  I think it might have been Dr. Laurann Montana.  Because of the painful mass and concern for recurrent hernia, she was sent to the emergency room for recommendation of a CAT scan.  Dr. Fredderick Phenix examined the patient and the patient did not seem particularly toxic.  She discussed case with me. This was at [facility] where I could not evaluate the patient. She got a CAT scan that was concerning for a loop of small bowel within the  hernia.  It was too painful to reduce.  I had a long discussion with the patient and her husband.  I recommended diagnostic laparoscopy with reduction of hernia and possible bowel resection.  If no bowel resection and no dead bowel, then repair with mesh.  If dead bowel, then primary repair.  Technique, risks, benefits, and alternatives were discussed in detail.  The patient was initially very hesitant and took some persuading but eventually came to grips but other alternatives were not good.  I did not think it was safe to delay this and she was obviously very uncomfortable.  She  and her husband agreed to proceed.  OPERATIVE FINDINGS:  She had some mild cotton candy wispy adhesions intra-abdominally but not severe.  No parastomal hernia.    She had an obvious hernia in right lower quadrant about 2 cm in size with a hard 3 x 2 cm nodule adherent to the fascia and involving the hernia sac.  Also a chronic scar as well with some nodularity along as well.  Concerning for possible endometriosis.  DESCRIPTION OF PROCEDURE:  Informed consent was confirmed.  The patient underwent general anesthesia without any difficulty.  Because of her PENICILLIN allergy, she received IV clindamycin and gentamicin.  She had a Foley catheter sterilely placed.  Her  arms were positioned at the side supine.  Her left lower quadrant ileostomy was draped out of the field. The rest of her abdomen and mons pubis were clipped, prepped, and draped in sterile fashion.  Surgical time-out confirmed our plan.  I placed a 5 mm port in the right upper quadrant along the costal ridge using optical entry technique.  Entry was clean.  I induced carbon dioxide insufflation.  Camera inspection revealed no injury.  I placed a 5 mm port in the left upper quadrant and in the right upper midline through her old incision.  I used sharp scissors to free off some mild adhesions to the anterior abdominal wall.  I did look down the right lower quadrant.  I could see an obvious hernia with hernia sac associated with it.  There was no bowel stuck in it.  I could still feel the mass in the anterior abdominal wall which was rather thinned out.  I felt like it was only partially reducing down.  I decided to do a Tapp approach to provide a mesh underlay repair since this was a recurrence & at the suprapubic region.  I scored the peritoneum supraumbilically from the right flank over towards the umbilicus trying to stay away from her ileostomy.  I used sharp and blunt dissection to get in the preperitoneal plane which was quite clean in the right flank down to the right pelvis.  However, the right lower quadrant paramedian region near her hernia sac had some dense adhesions to her rectus abdominis muscle.  I did have a few breaches in peritoneum there.  Eventually in doing that, it became obvious that The hard nodule was not reducing down.  I therefore decided to do an open incision in the anterior abdominal wall.  I excised her rather dimpled and irregular scar where the mass Was in the right suprapubic region.  She wished that excised anyway.  We got to the subcutaneous tissues and found out dense hard mass adherent to the fascia and hernia sac.  I excised it with cautery.  I  trimmed also some old scar off the fascia as well.  That allowed me to look into the peritoneal cavity.  I freed preperitoneal dissection and trimmed out the nodularity of the hernia sac.  I bluntly helped free the peritoneum off the right flank to the pubic rim and then more midline towards the left lower quadrant ileostomy but staying a few cm away from it.  There was some oozing on the rectus muscle that I controlled with cautery.  I saw no injury or other abnormalities.  I closed the peritoneal defect using 2-0 Vicryl running stitch.  Because this was a second recurrence and the resulting defect after removing endometrioma was now about 6 x 3 cm, I decided to  place a mesh in the preperitoneal place.  Due to risk of adhesions and her Crohn's, I decided to place in the preperitoneal space since that had already been created.  I used a dual side Physiomesh 20 x 15 cm.  I placed in the preperitoneal space primarily and vertically.  I closed the fascia using #1 PDS interrupted stitches to good result.  I had freed off skin flaps to help keep tension off the fascia.  It actually came together well without much tension.  Because of the subcutaneous defect between removing the facial mass and creating skin flaps, I placed a drain coming out the supraumbilical midline incision and drained the subcu to help avoid any postoperative hematoma and seroma.  I closed the subcutaneous tissues using 3-0 Vicryl interrupted stitches and 4-0 Monocryl subcuticular stitch.  This provided excellent closure and cosmetically looked much better.  I returned into the abdomen laparoscopically.  Closure was good.  I positioned the mesh so that it laid well going down to the pubic rim and lying well laterally and I kept the central part right underneath the primary fascial closure and laid over to the right flank as well. I used a secure strap absorbable tacker to help tack it to the right flank, posterior  fascia as well as down in the right suprapubic rectus muscle as well.  I then brought the peritoneum up and tacked that back up.  I covered up about 95% of the dual sided mesh except for just the superior rim.  I decided to secure the superior rim of the mesh using interrupted 0 Prolene stitches using a laparoscopic suture passer going through the anterior abdominal wall fascia in and out the mesh, in and out the peritoneum and then back out for 2 good bites.  That helped tack the superior and midline rims of the mesh well that helped tacking the peritoneum to help cover it.  I used secure strap to help tack just few other corners.  That provided good securing of the mesh superiorly and laterally and then inferiorly and it rested well down towards the pelvis and the flank.  This provided over 5 cm circumferential coverage all around it and again I checked with needle to make sure the mesh was positioned in the midline.  I did copious irrigation and hemostasis was excellent.  There was no evidence of bowel injury or obstruction or any abnormalities.  While she still had some mild adhesions, I left those alone with the interest of avoiding over dissecting.  I evacuated carbon dioxide and removed the ports.  I secured the drain with a 2-0 nylon stitch.  I closed the port sites using Monocryl.  I placed Steri-Strips.  Sterile dressings applied.  The patient has been extubated in the recovery room.  She was in stable condition.  I explained the operative findings to the patient's husband and parents, reasoning for combined open and laparoscopic underlay mesh approach, postoperative goals etc.  Questions answered and they expressed understanding and appreciation.     Ardeth Sportsman, MD     SCG/MEDQ  D:  02/13/2012  T:  02/13/2012  Job:  [id number]  cc:   Rolan Bucco, MD  [place]. Cliffton Asters, M.D. Fax: [phone]

## 2012-02-13 NOTE — ED Notes (Signed)
ZOX:[id number]<AV> Expected date:02/13/12<BR> Expected time: 1:00 AM<BR> Means of arrival:Ambulance<BR> Comments:<BR> [facility]  Dr Michaell Cowing accepting

## 2012-02-13 NOTE — Transfer of Care (Signed)
Immediate Anesthesia Transfer of Care Note  Patient: Sandra Love  Procedure(s) Performed: Procedure(s) (LRB) with comments: INSERTION OF MESH (N/A) LAPAROSCOPY DIAGNOSTIC () HERNIA REPAIR INCISIONAL (N/A) - Reduction and repair of incarcerated incisional hernia  Patient Location: PACU  Anesthesia Type:General  Level of Consciousness: awake, alert  and patient cooperative  Airway & Oxygen Therapy: Patient Spontanous Breathing and Patient connected to face mask oxygen  Post-op Assessment: Report given to PACU RN and Post -op Vital signs reviewed and stable  Post vital signs: Reviewed and stable  Complications: No apparent anesthesia complications

## 2012-02-13 NOTE — Anesthesia Postprocedure Evaluation (Signed)
Anesthesia Post Note  Patient: Sandra Love  Procedure(s) Performed: Procedure(s) (LRB): INSERTION OF MESH (N/A) LAPAROSCOPY DIAGNOSTIC () HERNIA REPAIR INCISIONAL (N/A)  Anesthesia type: General  Patient location: PACU  Post pain: Pain level controlled  Post assessment: Post-op Vital signs reviewed  Last Vitals:  Filed Vitals:   02/13/12 0800  BP: 106/55  Pulse: 63  Temp:   Resp: 14    Post vital signs: Reviewed  Level of consciousness: sedated  Complications: No apparent anesthesia complications

## 2012-02-13 NOTE — ED Notes (Signed)
Informed pt of the order for foley catheter, pt insisted on having it placed while in OR. RN is aware.

## 2012-02-13 NOTE — Brief Op Note (Addendum)
02/12/2012 - 02/13/2012  8:19 AM  PATIENT:  Sandra Love  31 y.o. female  PRE-OPERATIVE DIAGNOSIS:  Right lower quadrant incarcerated incisional hernia  POST-OPERATIVE DIAGNOSIS:  right lower quadrant recurrent incisional hernia containing abdominal wall mass  PROCEDURE:  Procedure(s) (LRB) with comments: INSERTION OF MESH (N/A) LAPAROSCOPY DIAGNOSTIC () HERNIA REPAIR INCISIONAL (N/A) - Reduction and repair of incarcerated incisional hernia  SURGEON:  Surgeon(s) and Role:    * Ardeth Sportsman, MD - Primary  ANESTHESIA:   local and general  EBL:  Total I/O In: 300 [I.V.:300] Out: -   BLOOD ADMINISTERED:none  DRAINS: (15) Blake drain(s) in the SQ of the abdominal wall of the RLQ   LOCAL MEDICATIONS USED:  BUPIVICAINE   SPECIMEN:  Source of Specimen:  Abdominal wall mass (?endometrioma?)  DISPOSITION OF SPECIMEN:  PATHOLOGY  COUNTS:  YES  TOURNIQUET:  * No tourniquets in log *  DICTATION: .Other Dictation: Dictation Number [id number]  PLAN OF CARE: Admit to inpatient   PATIENT DISPOSITION:  PACU - hemodynamically stable.   Delay start of Pharmacological VTE agent (>24hrs) due to surgical blood loss or risk of bleeding: no  I discussed the patient's status to the family (parents & husband).  Questions were answered.  They expressed understanding & appreciation.

## 2012-02-14 ENCOUNTER — Encounter (HOSPITAL_COMMUNITY): Payer: Self-pay | Admitting: Surgery

## 2012-02-14 MED ORDER — HYDROCODONE-ACETAMINOPHEN 5-325 MG PO TABS
1.0000 | ORAL_TABLET | ORAL | Status: DC | PRN
  Administered 2012-02-14: 2 via ORAL
  Filled 2012-02-14 (×2): qty 2

## 2012-02-14 NOTE — Care Management Note (Signed)
    Page 1 of 1   02/14/2012     1:13:05 PM   CARE MANAGEMENT NOTE 02/14/2012  Patient:  Sandra Love, Sandra Love   Account Number:  [id number]  Date Initiated:  02/14/2012  Documentation initiated by:  Lorenda Ishihara  Subjective/Objective Assessment:   31 yo female admitted s/p hernia repair. PTA lived at home.     Action/Plan:   home when stable   Anticipated DC Date:  02/15/2012   Anticipated DC Plan:  HOME/SELF CARE      DC Planning Services  CM consult      Choice offered to / List presented to:             Status of service:  Completed, signed off Medicare Important Message given?   (If response is "NO", the following Medicare IM given date fields will be blank) Date Medicare IM given:   Date Additional Medicare IM given:    Discharge Disposition:  HOME/SELF CARE  Per UR Regulation:  Reviewed for med. necessity/level of care/duration of stay  If discussed at Long Length of Stay Meetings, dates discussed:    Comments:

## 2012-02-14 NOTE — Progress Notes (Signed)
1 Day Post-Op  Subjective: POD#1 Still with some incisional pain Tolerating PO   Objective: Vital signs in last 24 hours: Temp:  [97.4 F (36.3 C)-99 F (37.2 C)] 98.6 F (37 C) (11/13 0623) Pulse Rate:  [61-76] 68  (11/13 0623) Resp:  [16-18] 18  (11/13 0623) BP: (94-109)/(50-61) 100/60 mmHg (11/13 0623) SpO2:  [98 %-100 %] 100 % (11/13 0623) Last BM Date: 02/13/12  Intake/Output from previous day: 11/12 0701 - 11/13 0700 In: 4141.7 [P.O.:1440; I.V.:2701.7] Out: 1610 [RUEAV:4098; Drains:90] Intake/Output this shift:   Abdomen soft, dressing dry and intact Drain serosang  Lab Results:   [facility] 02/12/12 2026  WBC 10.7*  HGB 14.1  HCT 41.0  PLT 326   BMET  Basename 02/12/12 2026  NA 137  K 3.9  CL 99  CO2 28  GLUCOSE 88  BUN 9  CREATININE 0.50  CALCIUM 9.8   PT/INR No results found for this basename: LABPROT:2,INR:2 in the last 72 hours ABG No results found for this basename: PHART:2,PCO2:2,PO2:2,HCO3:2 in the last 72 hours  Studies/Results: Ct Abdomen Pelvis W Contrast  02/12/2012  *RADIOLOGY REPORT*  Clinical Data: Possible right inguinal hernia.  CT ABDOMEN AND PELVIS WITH CONTRAST  Technique:  Multidetector CT imaging of the abdomen and pelvis was performed following the standard protocol during bolus administration of intravenous contrast.  Contrast: OMNIPAQUE IOHEXOL 300 MG/ML  SOLN  Comparison: None.  Findings: No confluent opacities in the lung bases.  Heart is normal size.  No effusions.  Multiple gallstones within the gallbladder.  No biliary ductal dilatation.  No focal abnormality within the liver, spleen, pancreas, adrenals or kidneys.  There is wall thickening within the rectum suggests the possibility of proctitis.  Recommend clinical correlation.  Uterus is retroverted, grossly unremarkable.  No adnexal masses or free fluid.  There is a left lower quadrant ostomy.  Prior subtotal colectomy.  There is a surgical scar noted in the right lower  pelvic wall. There is a small hernia associated with this scar.  Question old stoma. This contains a small bowel loop.  No evidence of bowel obstruction.  No inguinal hernia.  Small bowel is decompressed and grossly unremarkable.  No acute bony abnormality.  IMPRESSION: Changes of prior subtotal colectomy.  The remaining rectum appears thick-walled, question proctitis.  Small hernia at the base scar in the right lower pelvic wall, likely had a prior ostomy site.  This contains a small bowel loop. No evidence of bowel obstruction.  Cholelithiasis.   Original Report Authenticated By: Charlett Nose, M.D.     Anti-infectives: Anti-infectives     Start     Dose/Rate Route Frequency Ordered Stop   02/13/12 0600   gentamicin (GARAMYCIN) IVPB 100 mg  Status:  Discontinued     Comments: Pharmacy may adjust dosing strength, schedule, rate of infusion, etc as needed to optimize therapy       100 mg 200 mL/hr over 30 Minutes Intravenous On call to O.R. 02/13/12 0332 02/13/12 0352   02/13/12 0400   clindamycin (CLEOCIN) IVPB 600 mg        600 mg 100 mL/hr over 30 Minutes Intravenous On call to O.R. 02/13/12 0332 02/13/12 0455   02/13/12 0400   gentamicin (GARAMYCIN) 280 mg in dextrose 5 % 100 mL IVPB  Status:  Discontinued        280 mg 107 mL/hr over 60 Minutes Intravenous  Once 02/13/12 0355 02/13/12 0913          Assessment/Plan:  s/p Procedure(s) (LRB) with comments: INSERTION OF MESH (N/A) LAPAROSCOPY DIAGNOSTIC () HERNIA REPAIR INCISIONAL (N/A) - Reduction and repair of incarcerated incisional hernia  Advance diet, activity Oral pain meds  LOS: 2 days    Preethi Scantlebury A 02/14/2012

## 2012-02-15 MED ORDER — HYDROMORPHONE HCL 2 MG PO TABS: 2.0000 mg | ORAL_TABLET | ORAL | Status: DC | PRN

## 2012-02-15 MED ORDER — HYDROMORPHONE HCL 2 MG PO TABS
2.0000 mg | ORAL_TABLET | ORAL | Status: DC | PRN
  Administered 2012-02-15 (×2): 2 mg via ORAL
  Filled 2012-02-15 (×2): qty 1

## 2012-02-15 MED ORDER — ACETAMINOPHEN 10 MG/ML IV SOLN
1000.0000 mg | Freq: Four times a day (QID) | INTRAVENOUS | Status: DC
  Filled 2012-02-15: qty 100

## 2012-02-15 MED ORDER — HYDROMORPHONE HCL PF 1 MG/ML IJ SOLN: 0.5000 mg | INTRAMUSCULAR | Status: DC | PRN

## 2012-02-15 NOTE — Plan of Care (Signed)
Problem: Phase II Progression Outcomes Goal: Return of bowel function (flatus, BM) IF ABDOMINAL SURGERY:  Outcome: Completed/Met Date Met:  02/15/12 Pt has ileostomy.

## 2012-02-15 NOTE — Progress Notes (Signed)
Drain care taught. No concerns voiced. Patient and husband verbalized understanding.

## 2012-02-15 NOTE — Progress Notes (Signed)
Assessment unchanged. Pt and husband verbalized understanding of dc instructions given by Levon Hedger. Drain care also taught by [employer] with verbalized understanding. Handout and measure cups provided. Discharged via wc to front entrance to meet awaiting vehicle to carry home. Accompanied by husband and NT.

## 2012-02-15 NOTE — Progress Notes (Addendum)
I have seen and examined the patient and agree with the assessment and plans. Path showed endometrioma Douglas A. Magnus Ivan  MD, FACS   I discussed the patient's status to the family.  Questions were answered.  They expressed understanding & appreciation. Will f/u w pt in office soon Prob OK to d/c home later today  Ardeth Sportsman, M.D., F.A.C.S. Gastrointestinal and Minimally Invasive Surgery [facility] [address] [place], [place] [address] [phone] Main / Paging [phone] Voice Mail

## 2012-02-15 NOTE — Progress Notes (Signed)
2 Days Post-Op  Subjective: Still with pain issues (mostly related to timing of doses) also po Vicodin not controlling pain even when given on schedule.   Objective: Vital signs in last 24 hours: Temp:  [97.4 F (36.3 C)-98.5 F (36.9 C)] 98.5 F (36.9 C) (11/14 0612) Pulse Rate:  [67-69] 69  (11/14 0612) Resp:  [18] 18  (11/14 0612) BP: (106-111)/(65-73) 107/70 mmHg (11/14 0612) SpO2:  [100 %] 100 % (11/14 0612) Last BM Date: 02/15/12  Intake/Output from previous day: 11/13 0701 - 11/14 0700 In: 2180 [P.O.:2180] Out: 3610 [Urine:3550; Drains:60] Intake/Output this shift:    General appearance: alert, cooperative, appears stated age and mild distress Chest: CTA  Cardiac: RRR, No M/R/G Abdomen: tender to palpation, incisional pain, dressing C/D/I JP drain (60 ml ss) Extremities: warm to touch, no edema, no tenderness, + pulses.  Lab Results:   [facility] 02/12/12 2026  WBC 10.7*  HGB 14.1  HCT 41.0  PLT 326   BMET  Basename 02/12/12 2026  NA 137  K 3.9  CL 99  CO2 28  GLUCOSE 88  BUN 9  CREATININE 0.50  CALCIUM 9.8   PT/INR No results found for this basename: LABPROT:2,INR:2 in the last 72 hours ABG No results found for this basename: PHART:2,PCO2:2,PO2:2,HCO3:2 in the last 72 hours  Studies/Results: No results found.  Anti-infectives: Anti-infectives     Start     Dose/Rate Route Frequency Ordered Stop   02/13/12 0600   gentamicin (GARAMYCIN) IVPB 100 mg  Status:  Discontinued     Comments: Pharmacy may adjust dosing strength, schedule, rate of infusion, etc as needed to optimize therapy       100 mg 200 mL/hr over 30 Minutes Intravenous On call to O.R. 02/13/12 0332 02/13/12 0352   02/13/12 0400   clindamycin (CLEOCIN) IVPB 600 mg        600 mg 100 mL/hr over 30 Minutes Intravenous On call to O.R. 02/13/12 0332 02/13/12 0455   02/13/12 0400   gentamicin (GARAMYCIN) 280 mg in dextrose 5 % 100 mL IVPB  Status:  Discontinued        280 mg 107  mL/hr over 60 Minutes Intravenous  Once 02/13/12 0355 02/13/12 0913          Assessment/Plan: Patient Active Problem List  Diagnosis  . Crohn's disease of colon s/p abdominal colectomy / ileostomy  . 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 Recurrent RLQ incisional hernia with incarceration  . Anxiety  s/p Procedure(s) (LRB) with comments: INSERTION OF MESH (N/A) LAPAROSCOPY DIAGNOSTIC () HERNIA REPAIR INCISIONAL (N/A) - Reduction and repair of incarcerated incisional hernia Will add IV tylenol to get ahead of pain hopefully PO dilaudid for pain management Reducate patient on timing of pain meds Probable discharge later today once pain is better managed. OOB/IS   LOS: 3 days    Blenda Mounts [facility] Pager [phone]  02/15/2012

## 2012-02-15 NOTE — Discharge Summary (Signed)
Physician Discharge Summary  Patient ID: Sandra Love MRN: [id number] DOB/AGE: 32-Feb-1982 32 y.o.  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 female who presents to the Emergency Department complaining of a hernia to her RLQ with associated gradually worsening pain and warmth in the area. She denies fever, emesis, changes in appetite, diarrhea, constipation or the chance of pregnancy. She reports that the symptoms started a month ago. She states that she saw her PCP who could not push the hernia back in and reccommended she get a CT.She Incarcerated recurrent right lower quadrant abdominal wall incisional hernia. The patient has a hx of Crohn's dz s/p colon resection and ostomy placement. She subsequently developed an incisional hernia which has been repaired twice to this area. She has had the hernia recur, but is usually able to reduce it on her own. Over the last 4 weeks, has had the hernia in the same place, but has not been able to reduce it. Says that it has been tender to the area, is mildly increasing.  We were asked to see and admit the patient for surgical repair of her incsional hernia, she underwent  surgical repair by Dr. Michaell Cowing for her complaint on 02/13/12.  During her hospitilization she has received pain mangement, IVF, and abx, she has had some pain management issues post op; but these are now resolved, she has remained hemodynamically stable,and afebrile, tolerated a diet and has had normal bowel function. Is ambulating on her own without issues. She is stable for discharge to home self care. Her abdominal drain will remain in until she follows up with Dr.  Michaell Cowing in one weeks time. nursing staff has reviewed how to care for this with the patient. She has received written instructions for her post op care and has verbalized understanding of same. Additionally she has been given our contact number should she need to contact our office prior to her scheduled appointment.   Consults: None  Significant Diagnostic Studies: labs, microbiology, and radiology.  Treatments: IV hydration, antibiotics,analgesia, anticoagulation, respiratory therapy,and surgery.  Discharge Exam: Blood pressure 107/70, pulse 69, temperature 98.5 F (36.9 C), temperature source Oral, resp. rate 18, height 5' 3.5" (1.613 m), weight 128 lb (58.06 kg), last menstrual period 02/01/2012, SpO2 100.00%.  General appearance: alert, cooperative, appears stated age and mild distress  Chest: CTA  Cardiac: RRR, No M/R/G  Abdomen: tender to palpation, incisional pain, dressing C/D/I  JP drain (60 ml ss)  Extremities: warm to touch, no edema, no tenderness, + pulses   Disposition: Home self care Follow up with Dr. Michaell Cowing in 1 weeks time for drain removal at that time.     Medication List     As of 02/15/2012 12:27 PM    TAKE these medications         fexofenadine 180 MG tablet   Commonly known as: ALLEGRA   Take 180 mg by mouth daily.      HYDROmorphone 2 MG tablet   Commonly known as: DILAUDID   Take 1 tablet (2 mg total) by mouth every 4 (four) hours as needed.           Follow-up Information    Follow up with Ardeth Sportsman., MD.  In 1 week. (.  Please plan to arrive to office 30 minutes prior to your scheduled appointment for check in for check in procedures.   thank you)    Contact information:   [address] [place] [address] [phone]          Signed: Blenda Mounts [facility] Pager # [phone]  02/15/2012, 12:27 PM

## 2012-02-27 ENCOUNTER — Encounter (INDEPENDENT_AMBULATORY_CARE_PROVIDER_SITE_OTHER): Payer: Self-pay | Admitting: General Surgery

## 2012-02-27 ENCOUNTER — Encounter (INDEPENDENT_AMBULATORY_CARE_PROVIDER_SITE_OTHER): Payer: Self-pay | Admitting: Surgery

## 2012-02-27 ENCOUNTER — Ambulatory Visit (INDEPENDENT_AMBULATORY_CARE_PROVIDER_SITE_OTHER): Payer: BC Managed Care – PPO | Admitting: Surgery

## 2012-02-27 VITALS — BP 126/72 | HR 81 | Temp 98.6°F | Resp 18 | Ht 63.0 in | Wt 127.0 lb

## 2012-02-27 DIAGNOSIS — R209 Unspecified disturbances of skin sensation: Secondary | ICD-10-CM

## 2012-02-27 DIAGNOSIS — K430 Incisional hernia with obstruction, without gangrene: Secondary | ICD-10-CM

## 2012-02-27 DIAGNOSIS — N809 Endometriosis, unspecified: Secondary | ICD-10-CM | POA: Insufficient documentation

## 2012-02-27 DIAGNOSIS — N80129 Deep endometriosis of ovary, unspecified ovary: Secondary | ICD-10-CM

## 2012-02-27 DIAGNOSIS — R202 Paresthesia of skin: Secondary | ICD-10-CM | POA: Insufficient documentation

## 2012-02-27 NOTE — Progress Notes (Signed)
Subjective:     Patient ID: Sandra Love, female   DOB: 1981-01-22, 31 y.o.   MRN: [id number]  HPI  Sandra Love  1980/11/10 [id number]  Patient Care Team: Cala Bradford, Love as PCP - General (Family Medicine) Ardeth Sportsman, Love as Consulting Physician (General Surgery) Petra Kuba, Love as Consulting Physician (Gastroenterology)  This patient is a 31 y.o.female who presents today for surgical evaluation.   PROCEDURE PERFORMED:  1. Diagnostic laparoscopy.  2. Laparoscopic lysis of adhesions.  3. Abdominal wall exploration with excision of abdominal wall mass.  4. Primary right lower quadrant ventral incisional hernia repair.  5. Laparoscopically assisted underlay repair of ventral hernia repair  with mesh.   The patient comes in today feeling much better.  Animal drainage from drain.  Some right flank hip soreness but getting milder.  Using Tylenol only for pain.  Urinating fine.  Having regular bowel movements.  No fevers chills or sweats.  Hoping to get back to work maybe part-time next week.  Husband worried about her overdoing it.ORT OF SURGICAL PATHOLOGY FINAL DIAGNOSIS Diagnosis Soft tissue mass, simple excision, abdominal wall - ENDOMETRIOSIS. - NO EVIDENCE OF ATYPIA OR MALIGNANCY. Sandra Love Pathologist, Electronic Signature (Case signed 02/14/2012) Specimen Sandra Love and Clinical Information Specimen(s) Obtained: Soft tissue mass, simple excision, abdominal wall Specimen Clinical Information right lower quadrant incarcerated incisional hernia, questionable endometrioma [jl] Sandra Love Received in formalin are three pink to red shaggy, unoriented soft tissue fragments ranging from 1.5 to 2.5 cm in greatest dimension. All fragments are inked and serially sectioned to reveal a fibrotic pink to white, focally hemorrhagic cut surface.  Patient Active Problem List  Diagnosis  . Crohn's disease of colon s/p abdominal colectomy / ileostomy  . Ileostomy in  LLQ  . Recurrent RLQ abdominal incisional hernia s/p lap assisted repair 02/13/2012  . Anxiety  . Endometrioma in LLQ incisional hernia s/p excision 02/13/2012    Past Medical History  Diagnosis Date  . Crohn disease   . Anxiety     Past Surgical History  Procedure Date  . Hernia repair     RLQ VWH primary repair of old RLQ ileostomy site  . Ostomy     Ileostomy relocation RLQ to LLQ  . Subtotal colectomy 2001    [facility] age 23  . Insertion of mesh 02/13/2012    Procedure: INSERTION OF MESH;  Surgeon: Ardeth Sportsman, Love;  Location: [facility];  Service: General;  Laterality: N/A;  . Laparoscopy 02/13/2012    Procedure: LAPAROSCOPY DIAGNOSTIC;  Surgeon: Ardeth Sportsman, Love;  Location: [facility];  Service: General;;  . Incisional hernia repair 02/13/2012    Procedure: HERNIA REPAIR INCISIONAL;  Surgeon: Ardeth Sportsman, Love;  Location: [facility];  Service: General;  Laterality: N/A;  Reduction and repair of incarcerated incisional hernia    History   Social History  . Marital Status: Single    Spouse Name: N/A    Number of Children: N/A  . Years of Education: N/A   Occupational History  . Not on file.   Social History Main Topics  . Smoking status: Never Smoker   . Smokeless tobacco: Not on file  . Alcohol Use: Yes  . Drug Use: No  . Sexually Active:    Other Topics Concern  . Not on file   Social History Narrative  . No narrative on file    No family history on file.  Current Outpatient Prescriptions  Medication Sig Dispense Refill  . acetaminophen (TYLENOL) 325 MG tablet Take 650 mg by mouth every 6 (six) hours as needed.      . fexofenadine (ALLEGRA) 180 MG tablet Take 180 mg by mouth daily.      Marland Kitchen HYDROmorphone (DILAUDID) 2 MG tablet Take 1 tablet (2 mg total) by mouth every 4 (four) hours as needed.  20 tablet  0     Allergies  Allergen Reactions  . Penicillins Hives    & fever    BP 126/72  Pulse 81  Temp 98.6 F (37 C) (Temporal)  Resp 18  Ht 5\' 3"   (1.6 m)  Wt 127 lb (57.607 kg)  BMI 22.50 kg/m2  LMP 02/01/2012  Ct Abdomen Pelvis W Contrast  02/12/2012  *RADIOLOGY REPORT*  Clinical Data: Possible right inguinal hernia.  CT ABDOMEN AND PELVIS WITH CONTRAST  Technique:  Multidetector CT imaging of the abdomen and pelvis was performed following the standard protocol during bolus administration of intravenous contrast.  Contrast: OMNIPAQUE IOHEXOL 300 MG/ML  SOLN  Comparison: None.  Findings: No confluent opacities in the lung bases.  Heart is normal size.  No effusions.  Multiple gallstones within the gallbladder.  No biliary ductal dilatation.  No focal abnormality within the liver, spleen, pancreas, adrenals or kidneys.  There is wall thickening within the rectum suggests the possibility of proctitis.  Recommend clinical correlation.  Uterus is retroverted, grossly unremarkable.  No adnexal masses or free fluid.  There is a left lower quadrant ostomy.  Prior subtotal colectomy.  There is a surgical scar noted in the right lower pelvic wall. There is a small hernia associated with this scar.  Question old stoma. This contains a small bowel loop.  No evidence of bowel obstruction.  No inguinal hernia.  Small bowel is decompressed and grossly unremarkable.  No acute bony abnormality.  IMPRESSION: Changes of prior subtotal colectomy.  The remaining rectum appears thick-walled, question proctitis.  Small hernia at the base scar in the right lower pelvic wall, likely had a prior ostomy site.  This contains a small bowel loop. No evidence of bowel obstruction.  Cholelithiasis.   Original Report Authenticated By: Sandra Nose, M.D.      Review of Systems  Constitutional: Negative for fever, chills and diaphoresis.  HENT: Negative for ear pain, sore throat and trouble swallowing.   Eyes: Negative for photophobia and visual disturbance.  Respiratory: Negative for cough and choking.   Cardiovascular: Negative for chest pain and palpitations.    Gastrointestinal: Negative for nausea, vomiting, abdominal pain, diarrhea, constipation, blood in stool, anal bleeding and rectal pain.  Genitourinary: Negative for dysuria, frequency and difficulty urinating.  Musculoskeletal: Negative for myalgias and gait problem.  Skin: Negative for color change, pallor and rash.  Neurological: Negative for dizziness, speech difficulty, weakness and numbness.  Hematological: Negative for adenopathy.  Psychiatric/Behavioral: Negative for confusion and agitation. The patient is not nervous/anxious.        Objective:   Physical Exam  Constitutional: She is oriented to person, place, and time. She appears well-developed and well-nourished. No distress.  HENT:  Head: Normocephalic.  Mouth/Throat: Oropharynx is clear and moist. No oropharyngeal exudate.  Eyes: Conjunctivae normal and EOM are normal. Pupils are equal, round, and reactive to light. No scleral icterus.  Neck: Normal range of motion. No tracheal deviation present.  Cardiovascular: Normal rate and intact distal pulses.   Pulmonary/Chest: Effort normal. No respiratory distress. She exhibits no tenderness.  Abdominal: Soft. She  exhibits no distension. There is no tenderness. There is no CVA tenderness. No hernia. Hernia confirmed negative in the ventral area, confirmed negative in the right inguinal area and confirmed negative in the left inguinal area.         Incisions clean with normal healing ridges.  No hernias.  SQ blake drain w scant serous fluid - I removed w/o difficulty  Genitourinary: No vaginal discharge found.  Musculoskeletal: Normal range of motion. She exhibits no tenderness.  Lymphadenopathy:       Right: No inguinal adenopathy present.       Left: No inguinal adenopathy present.  Neurological: She is alert and oriented to person, place, and time. No cranial nerve deficit. She exhibits normal muscle tone. Coordination normal.  Skin: Skin is warm and dry. No rash noted. She is  not diaphoretic.  Psychiatric: She has a normal mood and affect. Her behavior is normal.       Assessment:     Recovering well only two weeks out from emergency surgery involving repair of recurrent incisional hernia, resection of endometrioma.    Plan:     Increase activity as tolerated to regular activity.  Do not push through pain.  Continue Tylenol  Okay to return to work half days next week.  Then unrestricted December 9.  Diet as tolerated. Bowel regimen to avoid problems.  Return to clinic p.r.n.   Instructions discussed.  Followup with primary care physician for other health issues as would normally be done.  Questions answered.  The patient expressed understanding and appreciation

## 2012-02-27 NOTE — Patient Instructions (Signed)
HERNIA REPAIR: POST OP INSTRUCTIONS  1. DIET: Follow a light bland diet the first 24 hours after arrival home, such as soup, liquids, crackers, etc.  Be sure to include lots of fluids daily.  Avoid fast food or heavy meals as your are more likely to get nauseated.  Eat a low fat the next few days after surgery. 2. Take your usually prescribed home medications unless otherwise directed. 3. PAIN CONTROL: a. Pain is best controlled by a usual combination of three different methods TOGETHER: i. Ice/Heat ii. Over the counter pain medication iii. Prescription pain medication b. Most patients will experience some swelling and bruising around the hernia(s) such as the bellybutton, groins, or old incisions.  Ice packs or heating pads (30-60 minutes up to 6 times a day) will help. Use ice for the first few days to help decrease swelling and bruising, then switch to heat to help relax tight/sore spots and speed recovery.  Some people prefer to use ice alone, heat alone, alternating between ice & heat.  Experiment to what works for you.  Swelling and bruising can take several weeks to resolve.   c. It is helpful to take an over-the-counter pain medication regularly for the first few weeks.  Choose one of the following that works best for you: i. Naproxen (Aleve, etc)  Two 220mg tabs twice a day ii. Ibuprofen (Advil, etc) Three 200mg tabs four times a day (every meal & bedtime) iii. Acetaminophen (Tylenol, etc) 325-650mg four times a day (every meal & bedtime) d. A  prescription for pain medication should be given to you upon discharge.  Take your pain medication as prescribed.  i. If you are having problems/concerns with the prescription medicine (does not control pain, nausea, vomiting, rash, itching, etc), please call us [phone] to see if we need to switch you to a different pain medicine that will work better for you and/or control your side effect better. ii. If you need a refill on your pain  medication, please contact your pharmacy.  They will contact our office to request authorization. Prescriptions will not be filled after 5 pm or on week-ends. 4. Avoid getting constipated.  Between the surgery and the pain medications, it is common to experience some constipation.  Increasing fluid intake and taking a fiber supplement (such as Metamucil, Citrucel, FiberCon, MiraLax, etc) 1-2 times a day regularly will usually help prevent this problem from occurring.  A mild laxative (prune juice, Milk of Magnesia, MiraLax, etc) should be taken according to package directions if there are no bowel movements after 48 hours.   5. Wash / shower every day.  You may shower over the dressings as they are waterproof.   6. Remove your waterproof bandages 5 days after surgery.  You may leave the incision open to air.  You may replace a dressing/Band-Aid to cover the incision for comfort if you wish.  Continue to shower over incision(s) after the dressing is off.    7. ACTIVITIES as tolerated:   a. You may resume regular (light) daily activities beginning the next day-such as daily self-care, walking, climbing stairs-gradually increasing activities as tolerated.  If you can walk 30 minutes without difficulty, it is safe to try more intense activity such as jogging, treadmill, bicycling, low-impact aerobics, swimming, etc. b. Save the most intensive and strenuous activity for last such as sit-ups, heavy lifting, contact sports, etc  Refrain from any heavy lifting or straining until you are off narcotics for pain control.     c. DO NOT PUSH THROUGH PAIN.  Let pain be your guide: If it hurts to do something, don't do it.  Pain is your body warning you to avoid that activity for another week until the pain goes down. d. You may drive when you are no longer taking prescription pain medication, you can comfortably wear a seatbelt, and you can safely maneuver your car and apply brakes. e. Bonita Quin may have sexual intercourse  when it is comfortable.  8. FOLLOW UP in our office a. Please call [facility] at [phone] to set up an appointment to see your surgeon in the office for a follow-up appointment approximately 2-3 weeks after your surgery. b. Make sure that you call for this appointment the day you arrive home to insure a convenient appointment time. 9.  IF YOU HAVE DISABILITY OR FAMILY LEAVE FORMS, BRING THEM TO THE OFFICE FOR PROCESSING.  DO NOT GIVE THEM TO YOUR DOCTOR.  WHEN TO CALL us [phone]: 1. Poor pain control 2. Reactions / problems with new medications (rash/itching, nausea, etc)  3. Fever over 101.5 F (38.5 C) 4. Inability to urinate 5. Nausea and/or vomiting 6. Worsening swelling or bruising 7. Continued bleeding from incision. 8. Increased pain, redness, or drainage from the incision   The clinic staff is available to answer your questions during regular business hours (8:30am-5pm).  Please don't hesitate to call and ask to speak to one of our nurses for clinical concerns.   If you have a medical emergency, go to the nearest emergency room or call 911.  A surgeon from [facility] is always on call at the hospitals in [facility], [place] [address], [address], [place], [place]  [address] ?  [address] [address], [place], [place]   [address] MAIN: [phone] ? TOLL FREE: [phone] ? FAX: [phone] [email or web address]  Paresthesia Paresthesia is an abnormal burning or prickling sensation. This sensation is generally felt in the hands, arms, legs, or feet. However, it may occur in any part of the body. It is usually not painful. The feeling may be described as:  Tingling or numbness.  "Pins and needles."  Skin crawling.  Buzzing.  Limbs "falling asleep."  Itching. Most people experience temporary (transient) paresthesia at some time in their lives. CAUSES  Paresthesia may occur when you breathe too quickly  (hyperventilation). It can also occur without any apparent cause. Commonly, paresthesia occurs when pressure is placed on a nerve. The feeling quickly goes away once the pressure is removed. For some people, however, paresthesia is a long-lasting (chronic) condition caused by an underlying disorder. The underlying disorder may be:  A traumatic, direct injury to nerves. Examples include a:  Broken (fractured) neck.  Fractured skull.  A disorder affecting the brain and spinal cord (central nervous system). Examples include:  Transverse myelitis.  Encephalitis.  Transient ischemic attack.  Multiple sclerosis.  Stroke.  Tumor or blood vessel problems, such as an arteriovenous malformation pressing against the brain or spinal cord.  A condition that damages the peripheral nerves (peripheral neuropathy). Peripheral nerves are not part of the brain and spinal cord. These conditions include:  Diabetes.  Peripheral vascular disease.  Nerve entrapment syndromes, such as carpal tunnel syndrome.  Shingles.  Hypothyroidism.  Vitamin B12 deficiencies.  Alcoholism.  Heavy metal poisoning (lead, arsenic).  Rheumatoid arthritis.  Systemic lupus erythematosus. DIAGNOSIS  Your caregiver will attempt to find the underlying cause of your paresthesia. Your caregiver may:  Take your  medical history.  Perform a physical exam.  Order various lab tests.  Order imaging tests. TREATMENT  Treatment for paresthesia depends on the underlying cause. HOME CARE INSTRUCTIONS  Avoid drinking alcohol.  You may consider massage or acupuncture to help relieve your symptoms.  Keep all follow-up appointments as directed by your caregiver. SEEK IMMEDIATE MEDICAL CARE IF:   You feel weak.  You have trouble walking or moving.  You have problems with speech or vision.  You feel confused.  You cannot control your bladder or bowel movements.  You feel numbness after an injury.  You  faint.  Your burning or prickling feeling gets worse when walking.  You have pain, cramps, or dizziness.  You develop a rash. MAKE SURE YOU:  Understand these instructions.  Will watch your condition.  Will get help right away if you are not doing well or get worse. Document Released: 03/10/2002 Document Revised: 06/12/2011 Document Reviewed: 12/09/2010 [facility] Patient Information 2013 [place], [place].

## 2012-10-31 ENCOUNTER — Ambulatory Visit (INDEPENDENT_AMBULATORY_CARE_PROVIDER_SITE_OTHER): Payer: BC Managed Care – PPO | Admitting: Surgery

## 2012-10-31 ENCOUNTER — Encounter (INDEPENDENT_AMBULATORY_CARE_PROVIDER_SITE_OTHER): Payer: Self-pay | Admitting: Surgery

## 2012-10-31 VITALS — BP 118/62 | HR 76 | Resp 14 | Ht 63.5 in | Wt 128.6 lb

## 2012-10-31 DIAGNOSIS — R1033 Periumbilical pain: Secondary | ICD-10-CM

## 2012-10-31 NOTE — Patient Instructions (Addendum)
I suspect the pain and lump is related to strain of scar tissue/muscle.  Should continue to improve on its on in the next two months.  If it worsens or does not resolve, consider reevaluation.  Managing Pain  Pain after surgery or related to activity is often due to strain/injury to muscle, tendon, nerves and/or incisions.  This pain is usually short-term and will improve in a few months.   Many people find it helpful to do the following things TOGETHER to help speed the process of healing and to get back to regular activity more quickly:  1. Avoid heavy physical activity a.  no lifting greater than 20 pounds b. Do not "push through" the pain.  Listen to your body and avoid positions and maneuvers than reproduce the pain c. Walking is okay as tolerated, but go slowly and stop when getting sore.  d. Remember: If it hurts to do it, then don't do it! 2. Take Anti-inflammatory medication  a. Take with food/snack around the clock for 1-2 weeks i. This helps the muscle and nerve tissues become less irritable and calm down faster b. Choose ONE of the following over-the-counter medications: i. Naproxen 220mg  tabs (ex. Aleve) 1-2 pills twice a day  ii. Ibuprofen 200mg  tabs (ex. Advil, Motrin) 3-4 pills with every meal and just before bedtime iii. Acetaminophen 500mg  tabs (Tylenol) 1-2 pills with every meal and just before bedtime 3. Use a Heating pad or Ice/Cold Pack a. 4-6 times a day b. May use warm bath/hottub  or showers 4. Try Gentle Massage and/or Stretching  a. at the area of pain many times a day b. stop if you feel pain - do not overdo it  Try these steps together to help you body heal faster and avoid making things get worse.  Doing just one of these things may not be enough.    If you are not getting better after two weeks or are noticing you are getting worse, contact our office for further advice; we may need to re-evaluate you & see what other things we can do to help.

## 2012-10-31 NOTE — Progress Notes (Signed)
Subjective:     Patient ID: Sandra Love, female   DOB: 26-Jul-1980, 32 y.o.   MRN: [id number]  HPI  Sandra Love  [birth year] [id number]  Patient Care Team: Cala Bradford, MD as PCP - General (Family Medicine) Ardeth Sportsman, MD as Consulting Physician (General Surgery) Petra Kuba, MD as Consulting Physician (Gastroenterology)  This patient is a 32 y.o.female who presents today for surgical evaluation at the request of self.   Reason for visit: Painful lump at incision.  Pleasant woman with history of Crohn's status post colectomy.  Has left lower corner and ileostomy.  An incisional hernia from prior bowel resection with endometrioma within it.  Underwent resection and closure and mesh repair 02/12/2012.  She had some pull some soreness but that gradually improved within the next month.  She felt an episode of sharp pain just below her bellybutton a few weeks ago.  It concerned her.  Sensitive when she twisted her sat up for while.  No nausea or vomiting.  Ileostomy working fine.  No fevers or chills.  No redness or drainage.  There was concern of infection or recurrent hernia.  She requested evaluation.  Since she made the appointment, things have much improved.  Moving better.  Not needing pain meds.  Walking fine.  Eating well.  Pain started a few days after completing her period.  No change with her menstrual cycle.  No spotting or bleeding.  No severe pain or changes in abdominal pain with her menstrual period  Patient Active Problem List   Diagnosis Date Noted  . Abdominal pain, periumbilical - most likely muscle strain 10/31/2012  . Endometrioma in LLQ incisional hernia s/p excision 02/13/2012 02/27/2012  . Paresthesia RLQ abd wall - mild 02/27/2012  . Crohn's disease of colon s/p abdominal colectomy / ileostomy 02/13/2012  . 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 Recurrent RLQ abdominal incisional hernia s/p lap assisted repair 02/13/2012 02/13/2012  . Anxiety      Past Medical History  Diagnosis Date  . Crohn disease   . Anxiety     Past Surgical History  Procedure Laterality Date  . Hernia repair      RLQ VWH primary repair of old RLQ ileostomy site  . Ostomy      Ileostomy relocation RLQ to LLQ  . Subtotal colectomy  2001    [facility] age 1  . Insertion of mesh  02/13/2012    Procedure: INSERTION OF MESH;  Surgeon: Ardeth Sportsman, MD;  Location: [facility];  Service: General;  Laterality: N/A;  . Laparoscopy  02/13/2012    Procedure: LAPAROSCOPY DIAGNOSTIC;  Surgeon: Ardeth Sportsman, MD;  Location: [facility];  Service: General;;  . Incisional hernia repair  02/13/2012    Procedure: HERNIA REPAIR INCISIONAL;  Surgeon: Ardeth Sportsman, MD;  Location: [facility];  Service: General;  Laterality: N/A;  Reduction and repair of incarcerated incisional hernia    History   Social History  . Marital Status: Married    Spouse Name: N/A    Number of Children: N/A  . Years of Education: N/A   Occupational History  . 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 Smoking status: Never Smoker   . Smokeless tobacco: Not on file  . Alcohol Use: Yes  . Drug Use: No  . Sexually Active:    Other Topics Concern  . Not on file  Social History Narrative  . No narrative on file    No family history on file.  Current Outpatient Prescriptions  Medication Sig Dispense Refill  . acetaminophen (TYLENOL) 325 MG tablet Take 650 mg by mouth every 6 (six) hours as needed.      . fexofenadine (ALLEGRA) 180 MG tablet Take 180 mg by mouth daily.      Marland Kitchen HYDROmorphone (DILAUDID) 2 MG tablet Take 1 tablet (2 mg total) by mouth every 4 (four) hours as needed.  20 tablet  0   No current facility-administered medications for this visit.     Allergies  Allergen Reactions  . Penicillins Hives    & fever    BP 118/62  Pulse 76  Resp 14  Ht 5' 3.5" (1.613 m)  Wt 128 lb 9.6 oz (58.333 kg)  BMI 22.42 kg/m2  No results  found.   Review of Systems  Constitutional: Negative for fever, chills, diaphoresis, appetite change and fatigue.  HENT: Negative for ear pain, sore throat, trouble swallowing, neck pain and ear discharge.   Eyes: Negative for photophobia, discharge and visual disturbance.  Respiratory: Negative for cough, choking, chest tightness and shortness of breath.   Cardiovascular: Negative for chest pain and palpitations.  Gastrointestinal: Positive for abdominal pain. Negative for nausea, vomiting, diarrhea, constipation, abdominal distention, anal bleeding and rectal pain.  Endocrine: Negative for cold intolerance and heat intolerance.  Genitourinary: Negative for dysuria, frequency and difficulty urinating.  Musculoskeletal: Negative for myalgias and gait problem.  Skin: Negative for color change, pallor and rash.  Allergic/Immunologic: Negative for environmental allergies, food allergies and immunocompromised state.  Neurological: Negative for dizziness, speech difficulty, weakness and numbness.  Hematological: Negative for adenopathy.  Psychiatric/Behavioral: Negative for confusion and agitation. The patient is not nervous/anxious.        Objective:   Physical Exam  Constitutional: She is oriented to person, place, and time. She appears well-developed and well-nourished. No distress.  HENT:  Head: Normocephalic.  Mouth/Throat: Oropharynx is clear and moist. No oropharyngeal exudate.  Eyes: Conjunctivae and EOM are normal. Pupils are equal, round, and reactive to light. No scleral icterus.  Neck: Normal range of motion. Neck supple. No tracheal deviation present.  Cardiovascular: Normal rate, regular rhythm and intact distal pulses.   Pulmonary/Chest: Effort normal and breath sounds normal. No stridor. No respiratory distress. She exhibits no tenderness.  Abdominal: Soft. She exhibits no distension and no mass. There is no tenderness. Hernia confirmed negative in the right inguinal area  and confirmed negative in the left inguinal area.    Genitourinary: No vaginal discharge found.  Musculoskeletal: Normal range of motion. She exhibits no tenderness.       Right elbow: She exhibits normal range of motion.       Left elbow: She exhibits normal range of motion.       Right wrist: She exhibits normal range of motion.       Left wrist: She exhibits normal range of motion.       Right hand: Normal strength noted.       Left hand: Normal strength noted.  Lymphadenopathy:       Head (right side): No posterior auricular adenopathy present.       Head (left side): No posterior auricular adenopathy present.    She has no cervical adenopathy.    She has no axillary adenopathy.       Right: No inguinal adenopathy present.       Left: No inguinal  adenopathy present.  Neurological: She is alert and oriented to person, place, and time. No cranial nerve deficit. She exhibits normal muscle tone. Coordination normal.  Skin: Skin is warm and dry. No rash noted. She is not diaphoretic. No erythema.  Psychiatric: Her speech is normal and behavior is normal. Judgment and thought content normal. Her mood appears anxious. Cognition and memory are normal.  Consolable       Assessment:     Pain and lumpiness and scar most likely secondary to strain.  Endometrioma less likely.  Stitch abscess even less likely.  Hernia extremely unlikely     Plan:     Because the pain has gone down and everything else has been stable, I am hopeful this is just musculoskeletal/scar tissue strain.  I recommended Tylenol and heat to help the pain go away sooner.  If okay with her gastroenterologist, could use ibuprofen instead.  She says she has used that often.  Would hesitate to do and the patient with Crohn's, but may be fine.  If things worsen or do not improve, she may benefit from exploration with removal of stitch abscess or endometrioma.  However, I am hopeful that will not come to that point.  She seems  reassured.  Increase activity as tolerated to regular activity.  Low impact exercise such as walking an hour a day at least ideal.  Do not push through pain.  Return to clinic as needed.   Instructions discussed.  Followup with primary care physician for other health issues as would normally be done.  Questions answered.  The patient expressed understanding and appreciation

## 2013-01-28 ENCOUNTER — Encounter (INDEPENDENT_AMBULATORY_CARE_PROVIDER_SITE_OTHER): Payer: BC Managed Care – PPO | Admitting: Surgery

## 2013-05-26 LAB — OB RESULTS CONSOLE HEPATITIS B SURFACE ANTIGEN: HEP B S AG: NEGATIVE

## 2013-05-26 LAB — OB RESULTS CONSOLE ABO/RH: RH Type: POSITIVE

## 2013-05-26 LAB — OB RESULTS CONSOLE RPR: RPR: NONREACTIVE

## 2013-05-26 LAB — OB RESULTS CONSOLE ANTIBODY SCREEN: ANTIBODY SCREEN: NEGATIVE

## 2013-05-26 LAB — OB RESULTS CONSOLE HIV ANTIBODY (ROUTINE TESTING): HIV: NONREACTIVE

## 2013-05-26 LAB — OB RESULTS CONSOLE RUBELLA ANTIBODY, IGM: Rubella: IMMUNE

## 2013-06-03 ENCOUNTER — Other Ambulatory Visit (HOSPITAL_COMMUNITY): Payer: Self-pay | Admitting: Obstetrics & Gynecology

## 2013-06-03 DIAGNOSIS — K5090 Crohn's disease, unspecified, without complications: Secondary | ICD-10-CM

## 2013-06-03 DIAGNOSIS — Z3689 Encounter for other specified antenatal screening: Secondary | ICD-10-CM

## 2013-06-03 DIAGNOSIS — O99619 Diseases of the digestive system complicating pregnancy, unspecified trimester: Secondary | ICD-10-CM

## 2013-06-04 ENCOUNTER — Encounter (HOSPITAL_COMMUNITY): Payer: Self-pay | Admitting: Obstetrics & Gynecology

## 2013-06-05 ENCOUNTER — Ambulatory Visit (HOSPITAL_COMMUNITY): Admission: RE | Admit: 2013-06-05 | Payer: BC Managed Care – PPO | Source: Ambulatory Visit

## 2013-06-05 ENCOUNTER — Ambulatory Visit (HOSPITAL_COMMUNITY): Payer: BC Managed Care – PPO

## 2013-06-11 ENCOUNTER — Encounter (HOSPITAL_COMMUNITY): Payer: Self-pay

## 2013-06-11 ENCOUNTER — Ambulatory Visit (HOSPITAL_COMMUNITY)
Admission: RE | Admit: 2013-06-11 | Discharge: 2013-06-11 | Disposition: A | Discharge status: Home / Self Care | Payer: BC Managed Care – PPO | Source: Ambulatory Visit | Attending: Obstetrics & Gynecology | Admitting: Obstetrics & Gynecology

## 2013-06-11 DIAGNOSIS — Z363 Encounter for antenatal screening for malformations: Secondary | ICD-10-CM | POA: Insufficient documentation

## 2013-06-11 DIAGNOSIS — O358XX0 Maternal care for other (suspected) fetal abnormality and damage, not applicable or unspecified: Secondary | ICD-10-CM | POA: Insufficient documentation

## 2013-06-11 DIAGNOSIS — O99619 Diseases of the digestive system complicating pregnancy, unspecified trimester: Secondary | ICD-10-CM

## 2013-06-11 DIAGNOSIS — Z1389 Encounter for screening for other disorder: Secondary | ICD-10-CM | POA: Insufficient documentation

## 2013-06-11 DIAGNOSIS — Z3689 Encounter for other specified antenatal screening: Secondary | ICD-10-CM

## 2013-06-11 DIAGNOSIS — K5090 Crohn's disease, unspecified, without complications: Secondary | ICD-10-CM

## 2013-09-18 LAB — OB RESULTS CONSOLE GBS: STREP GROUP B AG: NEGATIVE

## 2013-10-02 ENCOUNTER — Encounter (HOSPITAL_COMMUNITY): Payer: Self-pay | Admitting: Pharmacist

## 2013-10-05 NOTE — H&P (Signed)
Sandra Love is a 33 y.o. female presenting for primary CD secondary to breech presentation; declines attempt at version.  The patient's antepartum course is complicated by Crohn's Disease for which she is on no medication.  Her growth scans have been wnl.  She has an ileostomy in the LLQ and has history of ostomy relocation from the RLQ with mesh placement.  She is GBS negative.  Maternal Medical History:  Prenatal complications: no prenatal complications Prenatal Complications - Diabetes: none.    OB History   Grav Para Term Preterm Abortions TAB SAB Ect Mult Living   1              Past Medical History  Diagnosis Date  . Crohn disease   . Anxiety    Past Surgical History  Procedure Laterality Date  . Hernia repair      RLQ VWH primary repair of old RLQ ileostomy site  . Ostomy      Ileostomy relocation RLQ to LLQ  . Subtotal colectomy  2001    [facility] age 33  . Insertion of mesh  02/13/2012    Procedure: INSERTION OF MESH;  Surgeon: Ardeth SportsmanSteven C. Gross, MD;  Location: [facility];  Service: General;  Laterality: N/A;  . Laparoscopy  02/13/2012    Procedure: LAPAROSCOPY DIAGNOSTIC;  Surgeon: Ardeth SportsmanSteven C. Gross, MD;  Location: [facility];  Service: General;;  . Incisional hernia repair  02/13/2012    Procedure: HERNIA REPAIR INCISIONAL;  Surgeon: Ardeth SportsmanSteven C. Gross, MD;  Location: [facility];  Service: General;  Laterality: N/A;  Reduction and repair of incarcerated incisional hernia   Family History: family history is not on file. Social History:  reports that she has never smoked. She does not have any smokeless tobacco history on file. She reports that she drinks alcohol. She reports that she does not use illicit drugs.   Prenatal Transfer Tool  Maternal Diabetes: No Genetic Screening: Normal Maternal Ultrasounds/Referrals: Normal Fetal Ultrasounds or other Referrals:  None Maternal Substance Abuse:  No Significant Maternal Medications:  None Significant Maternal Lab Results:  Lab values  include: Group B Strep negative Other Comments:  None  ROS    Last menstrual period 01/08/2013. Maternal Exam:  Abdomen: Patient reports no abdominal tenderness. Fundal height is c/w dates.   Estimated fetal weight is 7#.   Fetal presentation: breech     Physical Exam  Constitutional: She is oriented to person, place, and time. She appears well-developed and well-nourished.  GI: Soft. There is no rebound and no guarding.  Neurological: She is alert and oriented to person, place, and time.  Skin: Skin is warm and dry.  Psychiatric: She has a normal mood and affect. Her behavior is normal.    Prenatal labs: ABO, Rh:   Antibody:   Rubella:   RPR:    HBsAg:    HIV:    GBS:     Assessment/Plan: 32yo G1 at 39 weeks with breech presentation; Crohn's Disease with LLQ ileostromy -Primary CD  Sandra Love 10/05/2013, 6:36 PM

## 2013-10-07 ENCOUNTER — Encounter (HOSPITAL_COMMUNITY): Payer: Self-pay

## 2013-10-09 ENCOUNTER — Encounter (HOSPITAL_COMMUNITY): Payer: Self-pay

## 2013-10-09 ENCOUNTER — Encounter (HOSPITAL_COMMUNITY)
Admission: RE | Admit: 2013-10-09 | Discharge: 2013-10-09 | Disposition: A | Discharge status: Home / Self Care | Payer: BC Managed Care – PPO | Source: Ambulatory Visit | Attending: Obstetrics & Gynecology | Admitting: Obstetrics & Gynecology

## 2013-10-09 HISTORY — DX: Unspecified asthma, uncomplicated: J45.909

## 2013-10-09 LAB — TYPE AND SCREEN
ABO/RH(D): AB POS
ANTIBODY SCREEN: NEGATIVE

## 2013-10-09 LAB — CBC
HEMATOCRIT: 36.9 % (ref 36.0–46.0)
Hemoglobin: 12.3 g/dL (ref 12.0–15.0)
MCH: 28 pg (ref 26.0–34.0)
MCHC: 33.3 g/dL (ref 30.0–36.0)
MCV: 84.1 fL (ref 78.0–100.0)
Platelets: 228 10*3/uL (ref 150–400)
RBC: 4.39 MIL/uL (ref 3.87–5.11)
RDW: 13.8 % (ref 11.5–15.5)
WBC: 10.4 10*3/uL (ref 4.0–10.5)

## 2013-10-09 LAB — ABO/RH: ABO/RH(D): AB POS

## 2013-10-09 MED ORDER — GENTAMICIN SULFATE 40 MG/ML IJ SOLN
INTRAVENOUS | Status: AC
  Administered 2013-10-10: 100 mL via INTRAVENOUS
  Administered 2013-10-10: 07:00:00 via INTRAVENOUS
  Filled 2013-10-09: qty 7.75

## 2013-10-09 NOTE — Patient Instructions (Signed)
[address]ett  10/09/2013   Your procedure is scheduled on:  10/10/13  [facility] through [facility] at 6 AM.  Pick up the phone at the desk and dial [phone].   Call this number if you have problems the morning of surgery: [phone]   Remember:   Do not eat food:After Midnight.  Do not drink clear liquids: After Midnight.  Take these medicines the morning of surgery with A SIP OF WATER: Zantac   Do not wear jewelry, make-up or nail polish.  Do not wear lotions, powders, or perfumes. You may wear deodorant.  Do not shave 48 hours prior to surgery.  Do not bring valuables to the hospital.  [facility] is not   responsible for any belongings or valuables brought to the hospital.  Contacts, dentures or bridgework may not be worn into surgery.  Leave suitcase in the car. After surgery it may be brought to your room.  For patients admitted to the hospital, checkout time is 11:00 AM the day of              discharge.   Patients discharged the day of surgery will not be allowed to drive             home.  Name and phone number of your driver: NA  Special Instructions:      Please read over the following fact sheets that you were given:   Surgical Site Infection Prevention

## 2013-10-10 ENCOUNTER — Inpatient Hospital Stay (HOSPITAL_COMMUNITY)
Admission: AD | Admit: 2013-10-10 | Discharge: 2013-10-13 | DRG: 765 | Disposition: A | Discharge status: Home / Self Care | Payer: BC Managed Care – PPO | Source: Ambulatory Visit | Attending: Obstetrics & Gynecology | Admitting: Obstetrics & Gynecology

## 2013-10-10 ENCOUNTER — Encounter (HOSPITAL_COMMUNITY): Payer: Self-pay | Admitting: Certified Registered"

## 2013-10-10 ENCOUNTER — Inpatient Hospital Stay (HOSPITAL_COMMUNITY): Payer: BC Managed Care – PPO | Admitting: Anesthesiology

## 2013-10-10 ENCOUNTER — Encounter (HOSPITAL_COMMUNITY): Payer: BC Managed Care – PPO | Admitting: Anesthesiology

## 2013-10-10 ENCOUNTER — Encounter (HOSPITAL_COMMUNITY)
Admission: AD | Disposition: A | Discharge status: Home / Self Care | Payer: Self-pay | Source: Ambulatory Visit | Attending: Obstetrics & Gynecology

## 2013-10-10 DIAGNOSIS — O99344 Other mental disorders complicating childbirth: Secondary | ICD-10-CM | POA: Diagnosis present

## 2013-10-10 DIAGNOSIS — O9989 Other specified diseases and conditions complicating pregnancy, childbirth and the puerperium: Secondary | ICD-10-CM

## 2013-10-10 DIAGNOSIS — K5090 Crohn's disease, unspecified, without complications: Secondary | ICD-10-CM | POA: Diagnosis present

## 2013-10-10 DIAGNOSIS — O99892 Other specified diseases and conditions complicating childbirth: Secondary | ICD-10-CM | POA: Diagnosis present

## 2013-10-10 DIAGNOSIS — F411 Generalized anxiety disorder: Secondary | ICD-10-CM | POA: Diagnosis present

## 2013-10-10 DIAGNOSIS — Z98891 History of uterine scar from previous surgery: Secondary | ICD-10-CM

## 2013-10-10 DIAGNOSIS — K219 Gastro-esophageal reflux disease without esophagitis: Secondary | ICD-10-CM | POA: Diagnosis present

## 2013-10-10 DIAGNOSIS — O321XX0 Maternal care for breech presentation, not applicable or unspecified: Principal | ICD-10-CM | POA: Diagnosis present

## 2013-10-10 DIAGNOSIS — J45909 Unspecified asthma, uncomplicated: Secondary | ICD-10-CM | POA: Diagnosis present

## 2013-10-10 LAB — RPR

## 2013-10-10 SURGERY — Surgical Case
Anesthesia: Spinal | Site: Abdomen

## 2013-10-10 MED ORDER — DIPHENHYDRAMINE HCL 50 MG/ML IJ SOLN: 12.5000 mg | INTRAMUSCULAR | Status: DC | PRN

## 2013-10-10 MED ORDER — MIDAZOLAM HCL 2 MG/2ML IJ SOLN
INTRAMUSCULAR | Status: AC
  Filled 2013-10-10: qty 2

## 2013-10-10 MED ORDER — OXYCODONE-ACETAMINOPHEN 5-325 MG PO TABS
1.0000 | ORAL_TABLET | ORAL | Status: DC | PRN
  Administered 2013-10-11 (×2): 1 via ORAL
  Administered 2013-10-11 – 2013-10-13 (×4): 2 via ORAL
  Filled 2013-10-10 (×2): qty 2
  Filled 2013-10-10: qty 1
  Filled 2013-10-10: qty 2
  Filled 2013-10-10: qty 1
  Filled 2013-10-10: qty 2

## 2013-10-10 MED ORDER — SCOPOLAMINE 1 MG/3DAYS TD PT72
MEDICATED_PATCH | TRANSDERMAL | Status: AC
  Administered 2013-10-10: 1.5 mg via TRANSDERMAL
  Filled 2013-10-10: qty 1

## 2013-10-10 MED ORDER — SIMETHICONE 80 MG PO CHEW: 80.0000 mg | CHEWABLE_TABLET | ORAL | Status: DC | PRN

## 2013-10-10 MED ORDER — NALBUPHINE HCL 10 MG/ML IJ SOLN: 5.0000 mg | INTRAMUSCULAR | Status: DC | PRN

## 2013-10-10 MED ORDER — PHENYLEPHRINE 8 MG IN D5W 100 ML (0.08MG/ML) PREMIX OPTIME
INJECTION | INTRAVENOUS | Status: AC
  Filled 2013-10-10: qty 100

## 2013-10-10 MED ORDER — SCOPOLAMINE 1 MG/3DAYS TD PT72
MEDICATED_PATCH | TRANSDERMAL | Status: AC
  Filled 2013-10-10: qty 1

## 2013-10-10 MED ORDER — SODIUM CHLORIDE 0.9 % IJ SOLN: 3.0000 mL | INTRAMUSCULAR | Status: DC | PRN

## 2013-10-10 MED ORDER — FENTANYL CITRATE 0.05 MG/ML IJ SOLN
INTRAMUSCULAR | Status: AC
  Filled 2013-10-10: qty 2

## 2013-10-10 MED ORDER — SODIUM BICARBONATE 8.4 % IV SOLN
INTRAVENOUS | Status: DC | PRN
  Administered 2013-10-10 (×2): 3 mL via EPIDURAL

## 2013-10-10 MED ORDER — TETANUS-DIPHTH-ACELL PERTUSSIS 5-2.5-18.5 LF-MCG/0.5 IM SUSP: 0.5000 mL | Freq: Once | INTRAMUSCULAR | Status: DC

## 2013-10-10 MED ORDER — ONDANSETRON HCL 4 MG/2ML IJ SOLN: 4.0000 mg | Freq: Three times a day (TID) | INTRAMUSCULAR | Status: DC | PRN

## 2013-10-10 MED ORDER — DIPHENHYDRAMINE HCL 25 MG PO CAPS: 25.0000 mg | ORAL_CAPSULE | ORAL | Status: DC | PRN

## 2013-10-10 MED ORDER — ONDANSETRON HCL 4 MG/2ML IJ SOLN
INTRAMUSCULAR | Status: AC
  Filled 2013-10-10: qty 2

## 2013-10-10 MED ORDER — FENTANYL CITRATE 0.05 MG/ML IJ SOLN: 25.0000 ug | INTRAMUSCULAR | Status: DC | PRN

## 2013-10-10 MED ORDER — SIMETHICONE 80 MG PO CHEW
80.0000 mg | CHEWABLE_TABLET | Freq: Three times a day (TID) | ORAL | Status: DC
  Administered 2013-10-10 – 2013-10-13 (×8): 80 mg via ORAL
  Filled 2013-10-10 (×8): qty 1

## 2013-10-10 MED ORDER — NALOXONE HCL 1 MG/ML IJ SOLN
1.0000 ug/kg/h | INTRAVENOUS | Status: DC | PRN
  Filled 2013-10-10: qty 2

## 2013-10-10 MED ORDER — DIPHENHYDRAMINE HCL 50 MG/ML IJ SOLN: 25.0000 mg | INTRAMUSCULAR | Status: DC | PRN

## 2013-10-10 MED ORDER — LACTATED RINGERS IV SOLN
Freq: Once | INTRAVENOUS | Status: AC
  Administered 2013-10-10 (×2): via INTRAVENOUS

## 2013-10-10 MED ORDER — BUPIVACAINE IN DEXTROSE 0.75-8.25 % IT SOLN
INTRATHECAL | Status: AC
  Filled 2013-10-10: qty 2

## 2013-10-10 MED ORDER — NALOXONE HCL 0.4 MG/ML IJ SOLN: 0.4000 mg | INTRAMUSCULAR | Status: DC | PRN

## 2013-10-10 MED ORDER — ALBUTEROL SULFATE (2.5 MG/3ML) 0.083% IN NEBU: 3.0000 mL | INHALATION_SOLUTION | Freq: Four times a day (QID) | RESPIRATORY_TRACT | Status: DC | PRN

## 2013-10-10 MED ORDER — SENNOSIDES-DOCUSATE SODIUM 8.6-50 MG PO TABS
2.0000 | ORAL_TABLET | ORAL | Status: DC
  Filled 2013-10-10 (×2): qty 2

## 2013-10-10 MED ORDER — ONDANSETRON HCL 4 MG/2ML IJ SOLN: 4.0000 mg | INTRAMUSCULAR | Status: DC | PRN

## 2013-10-10 MED ORDER — OXYTOCIN 10 UNIT/ML IJ SOLN
INTRAMUSCULAR | Status: AC
  Filled 2013-10-10: qty 4

## 2013-10-10 MED ORDER — PHENYLEPHRINE 8 MG IN D5W 100 ML (0.08MG/ML) PREMIX OPTIME
INJECTION | INTRAVENOUS | Status: DC | PRN
  Administered 2013-10-10: 60 ug/min via INTRAVENOUS

## 2013-10-10 MED ORDER — KETOROLAC TROMETHAMINE 30 MG/ML IJ SOLN: 30.0000 mg | Freq: Four times a day (QID) | INTRAMUSCULAR | Status: AC | PRN

## 2013-10-10 MED ORDER — LANOLIN HYDROUS EX OINT: 1.0000 "application " | TOPICAL_OINTMENT | CUTANEOUS | Status: DC | PRN

## 2013-10-10 MED ORDER — ONDANSETRON HCL 4 MG/2ML IJ SOLN
INTRAMUSCULAR | Status: DC | PRN
  Administered 2013-10-10: 4 mg via INTRAVENOUS

## 2013-10-10 MED ORDER — MORPHINE SULFATE 0.5 MG/ML IJ SOLN
INTRAMUSCULAR | Status: AC
  Filled 2013-10-10: qty 10

## 2013-10-10 MED ORDER — SCOPOLAMINE 1 MG/3DAYS TD PT72
1.0000 | MEDICATED_PATCH | Freq: Once | TRANSDERMAL | Status: DC
  Filled 2013-10-10: qty 1

## 2013-10-10 MED ORDER — FENTANYL CITRATE 0.05 MG/ML IJ SOLN
INTRAMUSCULAR | Status: DC | PRN
  Administered 2013-10-10: 12.5 ug via EPIDURAL
  Administered 2013-10-10: 75 ug via EPIDURAL
  Administered 2013-10-10: 12.5 ug via INTRATHECAL
  Administered 2013-10-10 (×2): 50 ug via INTRAVENOUS

## 2013-10-10 MED ORDER — IBUPROFEN 600 MG PO TABS
600.0000 mg | ORAL_TABLET | Freq: Four times a day (QID) | ORAL | Status: DC
  Administered 2013-10-10 – 2013-10-13 (×11): 600 mg via ORAL
  Filled 2013-10-10 (×11): qty 1

## 2013-10-10 MED ORDER — MORPHINE SULFATE (PF) 0.5 MG/ML IJ SOLN
INTRAMUSCULAR | Status: DC | PRN
  Administered 2013-10-10: 1 mg via INTRAVENOUS

## 2013-10-10 MED ORDER — MORPHINE SULFATE (PF) 0.5 MG/ML IJ SOLN
INTRAMUSCULAR | Status: DC | PRN
Start: 2013-10-10 — End: 2013-10-10
  Administered 2013-10-10: 3.8 mg via EPIDURAL
  Administered 2013-10-10: 1 mg via EPIDURAL
  Administered 2013-10-10: .2 mg via INTRATHECAL

## 2013-10-10 MED ORDER — DEXTROSE IN LACTATED RINGERS 5 % IV SOLN: INTRAVENOUS | Status: DC

## 2013-10-10 MED ORDER — KETOROLAC TROMETHAMINE 30 MG/ML IJ SOLN
INTRAMUSCULAR | Status: AC
  Filled 2013-10-10: qty 1

## 2013-10-10 MED ORDER — OXYTOCIN 40 UNITS IN LACTATED RINGERS INFUSION - SIMPLE MED: 62.5000 mL/h | INTRAVENOUS | Status: AC

## 2013-10-10 MED ORDER — ZOLPIDEM TARTRATE 5 MG PO TABS: 5.0000 mg | ORAL_TABLET | Freq: Every evening | ORAL | Status: DC | PRN

## 2013-10-10 MED ORDER — LACTATED RINGERS IV SOLN
INTRAVENOUS | Status: DC
  Administered 2013-10-10: 08:00:00 via INTRAVENOUS

## 2013-10-10 MED ORDER — KETOROLAC TROMETHAMINE 30 MG/ML IJ SOLN
30.0000 mg | Freq: Four times a day (QID) | INTRAMUSCULAR | Status: AC | PRN
  Administered 2013-10-10: 30 mg via INTRAMUSCULAR

## 2013-10-10 MED ORDER — MEPERIDINE HCL 25 MG/ML IJ SOLN: 6.2500 mg | INTRAMUSCULAR | Status: DC | PRN

## 2013-10-10 MED ORDER — WITCH HAZEL-GLYCERIN EX PADS: 1.0000 "application " | MEDICATED_PAD | CUTANEOUS | Status: DC | PRN

## 2013-10-10 MED ORDER — DIBUCAINE 1 % RE OINT: 1.0000 "application " | TOPICAL_OINTMENT | RECTAL | Status: DC | PRN

## 2013-10-10 MED ORDER — METOCLOPRAMIDE HCL 5 MG/ML IJ SOLN: 10.0000 mg | Freq: Three times a day (TID) | INTRAMUSCULAR | Status: DC | PRN

## 2013-10-10 MED ORDER — LACTATED RINGERS IV SOLN
INTRAVENOUS | Status: DC
  Administered 2013-10-10: 17:00:00 via INTRAVENOUS
  Administered 2013-10-11: 999 mL via INTRAVENOUS

## 2013-10-10 MED ORDER — PRENATAL MULTIVITAMIN CH
1.0000 | ORAL_TABLET | Freq: Every day | ORAL | Status: DC
  Administered 2013-10-11 – 2013-10-12 (×2): 1 via ORAL
  Filled 2013-10-10 (×2): qty 1

## 2013-10-10 MED ORDER — DIPHENHYDRAMINE HCL 25 MG PO CAPS: 25.0000 mg | ORAL_CAPSULE | Freq: Four times a day (QID) | ORAL | Status: DC | PRN

## 2013-10-10 MED ORDER — MENTHOL 3 MG MT LOZG: 1.0000 | LOZENGE | OROMUCOSAL | Status: DC | PRN

## 2013-10-10 MED ORDER — SIMETHICONE 80 MG PO CHEW
80.0000 mg | CHEWABLE_TABLET | ORAL | Status: DC
  Administered 2013-10-11 – 2013-10-13 (×2): 80 mg via ORAL
  Filled 2013-10-10 (×2): qty 1

## 2013-10-10 MED ORDER — OXYTOCIN 10 UNIT/ML IJ SOLN
40.0000 [IU] | INTRAVENOUS | Status: DC | PRN
  Administered 2013-10-10: 40 [IU] via INTRAVENOUS

## 2013-10-10 MED ORDER — SCOPOLAMINE 1 MG/3DAYS TD PT72
1.0000 | MEDICATED_PATCH | Freq: Once | TRANSDERMAL | Status: DC
  Administered 2013-10-10: 1.5 mg via TRANSDERMAL

## 2013-10-10 MED ORDER — ONDANSETRON HCL 4 MG PO TABS: 4.0000 mg | ORAL_TABLET | ORAL | Status: DC | PRN

## 2013-10-10 MED ORDER — BUPIVACAINE IN DEXTROSE 0.75-8.25 % IT SOLN
INTRATHECAL | Status: DC | PRN
  Administered 2013-10-10: 1.2 mL via INTRATHECAL

## 2013-10-10 MED ORDER — METOCLOPRAMIDE HCL 5 MG/ML IJ SOLN: 10.0000 mg | Freq: Once | INTRAMUSCULAR | Status: DC | PRN

## 2013-10-10 SURGICAL SUPPLY — 30 items
BENZOIN TINCTURE PRP APPL 2/3 (GAUZE/BANDAGES/DRESSINGS) ×3 IMPLANT
CLAMP CORD UMBIL (MISCELLANEOUS) ×3 IMPLANT
CLOSURE WOUND 1/2 X4 (GAUZE/BANDAGES/DRESSINGS) ×1
CLOTH BEACON ORANGE TIMEOUT ST (SAFETY) ×3 IMPLANT
DRAPE LG THREE QUARTER DISP (DRAPES) ×3 IMPLANT
DRSG OPSITE POSTOP 4X10 (GAUZE/BANDAGES/DRESSINGS) ×3 IMPLANT
DRSG TEGADERM 4X10 (GAUZE/BANDAGES/DRESSINGS) ×3 IMPLANT
DURAPREP 26ML APPLICATOR (WOUND CARE) ×3 IMPLANT
ELECT REM PT RETURN 9FT ADLT (ELECTROSURGICAL) ×6
ELECTRODE REM PT RTRN 9FT ADLT (ELECTROSURGICAL) ×2 IMPLANT
GLOVE BIO SURGEON STRL SZ 6 (GLOVE) ×3 IMPLANT
GLOVE BIOGEL PI IND STRL 6 (GLOVE) ×2 IMPLANT
GLOVE BIOGEL PI IND STRL 7.0 (GLOVE) ×9 IMPLANT
GLOVE BIOGEL PI INDICATOR 6 (GLOVE) ×4
GLOVE BIOGEL PI INDICATOR 7.0 (GLOVE) ×18
GLOVE SURG SS PI 7.0 STRL IVOR (GLOVE) ×21 IMPLANT
GOWN STRL REUS W/TWL LRG LVL3 (GOWN DISPOSABLE) ×9 IMPLANT
KIT ABG SYR 3ML LUER SLIP (SYRINGE) ×3 IMPLANT
NEEDLE HYPO 25X5/8 SAFETYGLIDE (NEEDLE) ×3 IMPLANT
NS IRRIG 1000ML POUR BTL (IV SOLUTION) ×3 IMPLANT
PACK C SECTION WH (CUSTOM PROCEDURE TRAY) ×3 IMPLANT
PAD OB MATERNITY 4.3X12.25 (PERSONAL CARE ITEMS) ×6 IMPLANT
STRIP CLOSURE SKIN 1/2X4 (GAUZE/BANDAGES/DRESSINGS) ×2 IMPLANT
SUT CHROMIC 0 CTX 36 (SUTURE) ×9 IMPLANT
SUT MON AB 2-0 CT1 27 (SUTURE) ×3 IMPLANT
SUT VIC AB 0 CT1 36 (SUTURE) ×3 IMPLANT
SUT VIC AB 4-0 KS 27 (SUTURE) ×3 IMPLANT
TOWEL OR 17X24 6PK STRL BLUE (TOWEL DISPOSABLE) ×9 IMPLANT
TRAY FOLEY CATH 14FR (SET/KITS/TRAYS/PACK) ×3 IMPLANT
WATER STERILE IRR 1000ML POUR (IV SOLUTION) ×3 IMPLANT

## 2013-10-10 NOTE — Anesthesia Postprocedure Evaluation (Signed)
  Anesthesia Post-op Note  Patient: Sandra Love  Procedure(s) Performed: Procedure(s) with comments: PRIMARY CESAREAN SECTION (N/A) - spinal and epidural combo   Patient Location: PACU  Anesthesia Type:General  Level of Consciousness: awake, alert  and oriented  Airway and Oxygen Therapy: Patient Spontanous Breathing  Post-op Pain: none  Post-op Assessment: Post-op Vital signs reviewed, Patient's Cardiovascular Status Stable, Respiratory Function Stable, Patent Airway, No signs of Nausea or vomiting, Pain level controlled, No headache and No backache  Post-op Vital Signs: Reviewed and stable  Last Vitals:  Filed Vitals:   10/10/13 0945  BP:   Pulse: 61  Temp: 36.7 C  Resp: 17    Complications: No apparent anesthesia complications

## 2013-10-10 NOTE — Transfer of Care (Signed)
Immediate Anesthesia Transfer of Care Note  Patient: Sandra Love  Procedure(s) Performed: Procedure(s) with comments: PRIMARY CESAREAN SECTION (N/A) - spinal and epidural combo   Patient Location: PACU  Anesthesia Type:Spinal and Epidural  Level of Consciousness: awake and alert   Airway & Oxygen Therapy: Patient Spontanous Breathing  Post-op Assessment: Report given to PACU RN and Post -op Vital signs reviewed and stable  Post vital signs: Reviewed and stable  Complications: No apparent anesthesia complications

## 2013-10-10 NOTE — Anesthesia Procedure Notes (Signed)
Spinal  Patient location during procedure: OR Start time: 10/10/2013 7:28 AM End time: 10/10/2013 7:31 AM Staffing Anesthesiologist: Leilani Able Performed by: anesthesiologist  Preanesthetic Checklist Completed: patient identified, surgical consent, pre-op evaluation, timeout performed, IV checked, risks and benefits discussed and monitors and equipment checked Spinal Block Patient position: sitting Prep: DuraPrep Patient monitoring: cardiac monitor, continuous pulse ox, blood pressure and heart rate Approach: midline Location: L3-4 Injection technique: catheter Needle Needle type: Tuohy and Sprotte  Needle gauge: 24 G Needle length: 12.7 cm Needle insertion depth: 4 cm Catheter type: closed end flexible Catheter size: 19 g Catheter at skin depth: 9.5 cm Assessment Sensory level: T8

## 2013-10-10 NOTE — Lactation Note (Addendum)
This note was copied from the chart of Sandra Love. Lactation Consultation Note  Patient Name: Sandra Love [id number] Date: 10/10/2013 Reason for consult: Initial assessment Baby 12 hours of life. Mom states that she thinks breastfeeding is going okay, but will call for assistance with latching at next BF. Mom has LC brochure, aware of OP/BFSG and community resources. Enc mom to offer baby lots of STS, feed baby with cues and at least 8-12/24 hours. Enc mom to call for assistance.  Maternal Data Has patient been taught Hand Expression?:  (patient will call when baby nurses next.) Does the patient have breastfeeding experience prior to this delivery?: No  Feeding Feeding Type:  (Patient will call for next feed. )  LATCH Score/Interventions                      Lactation Tools Discussed/Used     Consult Status Consult Status: Follow-up Date: 10/11/13 Follow-up type: In-patient    Geralynn Ochs 10/10/2013, 8:08 PM

## 2013-10-10 NOTE — Op Note (Signed)
Gardiner Barefoot PROCEDURE DATE: 10/10/2013  PREOPERATIVE DIAGNOSIS: Intrauterine pregnancy at  [redacted]w[redacted]d weeks gestation, breech presentation, Crohn's disease with ileostomy  POSTOPERATIVE DIAGNOSIS: The same  PROCEDURE: Primary Low Transverse Cesarean Section  SURGEON:  Dr. Mitchel Honour  INDICATIONS: Sandra Love is a 33 y.o. G1P0 at [redacted]w[redacted]d scheduled for cesarean section secondary to breech presentation.  The risks of cesarean section discussed with the patient included but were not limited to: bleeding which may require transfusion or reoperation; infection which may require antibiotics; injury to bowel, bladder, ureters or other surrounding organs; injury to the fetus; need for additional procedures including hysterectomy in the event of a life-threatening hemorrhage; placental abnormalities wth subsequent pregnancies, incisional problems, thromboembolic phenomenon and other postoperative/anesthesia complications. The patient concurred with the proposed plan, giving informed written consent for the procedure.    FINDINGS:  Viable female infant in breech presntation, APGARs 8,9:  Weight pending  Clear amniotic fluid.  Intact placenta, three vessel cord.  Grossly normal uterus, ovaries and fallopian tubes. .   ANESTHESIA:   Spinal ESTIMATED BLOOD LOSS: 600 ml SPECIMENS: Placenta sent to L&D COMPLICATIONS: None immediate  PROCEDURE IN DETAIL:  The patient received intravenous antibiotics and had sequential compression devices applied to her lower extremities while in the preoperative area.  She was then taken to the operating room where spinal anesthesia was administered and was found to be adequate. She was then placed in a dorsal supine position with a leftward tilt, and prepped and draped in a sterile manner using a Tegaderm to cover the ileostomy.  A foley catheter was placed into her bladder and attached to constant gravity.  After an adequate timeout was performed, a Pfannenstiel skin incision  was made with scalpel and carried through to the underlying layer of fascia. The fascia was incised in the midline and this incision was extended bilaterally using the Mayo scissors. Kocher clamps were applied to the superior aspect of the fascial incision and the underlying rectus muscles were dissected off bluntly. A similar process was carried out on the inferior aspect of the facial incision. The rectus muscles were separated in the midline bluntly and the peritoneum was entered bluntly.   A bladder blade was created sharply and developed bluntly.  Bladder was protected behind the bladder blade.  A transverse hysterotomy was made with a scalpel and extended bilaterally bluntly. The bladder blade was then removed. The infant was successfully delivered using typical breech maneuvers, and cord was clamped and cut and infant was handed over to awaiting neonatology team. Uterine massage was then administered and the placenta delivered intact with three-vessel cord. The uterus was cleared of clot and debris.  The hysterotomy was closed with #1 Chromic.  A second imbricating suture of #1 Chromic was used to reinforce the incision and aid in hemostasis.  The peritoneum and rectus muscles were noted to be hemostatic and were reapproximated using 3-0 monocryl.  The fascia was closed with 0-Vicryl in a running fashion with good restoration of anatomy.  The subcutaneus tissue was copiously irrigated.  The skin was closed with 4-0 Vicryl in a subcuticular fashion.  Pt tolerated the procedure will.  All counts were correct x2.  Pt went to the recovery room in stable condition.

## 2013-10-10 NOTE — Lactation Note (Addendum)
This note was copied from the chart of Sandra Purvis Kiltsshley Barrie. Lactation Consultation Note  Patient Name: Sandra Love ZOXWR'UToday's Date: 10/10/2013 Reason for consult: Follow-up assessment Baby 13 hours of life. Called in to assist with latch. Baby just finished bath. Attempted to latch baby several times in the football position on left breast. Mom return-demonstrated hand expression, with no colostrum visible. Baby licks and chews at the breasts, but does not attempt to latch. LC placed gloved finger in baby mouth, but baby only sucked a couple of times, but did not sustain a latch. Baby gagged several times, seems to be swallowing extra fluids. Patted baby, wouldn't spit it up, but can feel it in chest as baby breaths. Enc mom to continue offering lots of STS and attempts at breast. Enc both parents to let the baby practice sucking their finger. Enc mom to massage breasts and continue hand expressing. Enc mom to offer breast with cues, and at least 8-12 times/24 hours. Enc mom to call out for assistance as needed. Discussed assessment and plan of care with patient's MBU RN Jan W.  Maternal Data Has patient been taught Hand Expression?: Yes Does the patient have breastfeeding experience prior to this delivery?: No  Feeding Feeding Type: Breast Fed Length of feed: 0 min  LATCH Score/Interventions Latch: Too sleepy or reluctant, no latch achieved, no sucking elicited. Intervention(s): Skin to skin;Teach feeding cues;Waking techniques Intervention(s): Adjust position;Assist with latch;Breast compression  Audible Swallowing: None  Type of Nipple: Flat (short shaft.)  Comfort (Breast/Nipple): Soft / non-tender     Hold (Positioning): Assistance needed to correctly position infant at breast and maintain latch. Intervention(s): Breastfeeding basics reviewed;Support Pillows;Position options  LATCH Score: 4  Lactation Tools Discussed/Used     Consult Status Consult Status: Follow-up Date:  10/11/13 Follow-up type: In-patient    Geralynn OchsWILLIARD, Athalee Esterline 10/10/2013, 9:53 PM

## 2013-10-10 NOTE — Anesthesia Preprocedure Evaluation (Signed)
Anesthesia Evaluation  Patient identified by MRN, date of birth, ID band Patient awake    Reviewed: Allergy & Precautions, H&P , NPO status , Patient's Chart, lab work & pertinent test results, reviewed documented beta blocker date and time   History of Anesthesia Complications Negative for: history of anesthetic complications  Airway Mallampati: II TM Distance: >3 FB Neck ROM: full    Dental  (+) Teeth Intact   Pulmonary asthma (seasonal - rare inhaler use) ,  breath sounds clear to auscultation        Cardiovascular negative cardio ROS  Rhythm:regular Rate:Normal     Neuro/Psych Anxiety negative neurological ROS     GI/Hepatic Neg liver ROS, GERD-  Medicated,LLQ ileostomy Crohn's disease   Endo/Other  negative endocrine ROS  Renal/GU negative Renal ROS     Musculoskeletal   Abdominal   Peds  Hematology negative hematology ROS (+)   Anesthesia Other Findings   Reproductive/Obstetrics (+) Pregnancy (breech, primary C/S, h/o 5-6 abdominal surgeries for Crohn's disease and ventral hernia repairs with mesh, current ileostomy)                           Anesthesia Physical Anesthesia Plan  ASA: II  Anesthesia Plan: Combined Spinal and Epidural   Post-op Pain Management:    Induction:   Airway Management Planned:   Additional Equipment:   Intra-op Plan:   Post-operative Plan:   Informed Consent: I have reviewed the patients History and Physical, chart, labs and discussed the procedure including the risks, benefits and alternatives for the proposed anesthesia with the patient or authorized representative who has indicated his/her understanding and acceptance.     Plan Discussed with: Surgeon and CRNA  Anesthesia Plan Comments:         Anesthesia Quick Evaluation

## 2013-10-10 NOTE — Progress Notes (Signed)
No change to H&P. 

## 2013-10-11 LAB — CBC
HEMATOCRIT: 31.9 % — AB (ref 36.0–46.0)
Hemoglobin: 10.4 g/dL — ABNORMAL LOW (ref 12.0–15.0)
MCH: 27.4 pg (ref 26.0–34.0)
MCHC: 32.6 g/dL (ref 30.0–36.0)
MCV: 84.2 fL (ref 78.0–100.0)
Platelets: 192 10*3/uL (ref 150–400)
RBC: 3.79 MIL/uL — ABNORMAL LOW (ref 3.87–5.11)
RDW: 14 % (ref 11.5–15.5)
WBC: 16.6 10*3/uL — AB (ref 4.0–10.5)

## 2013-10-11 LAB — BIRTH TISSUE RECOVERY COLLECTION (PLACENTA DONATION)

## 2013-10-11 MED ORDER — CETIRIZINE HCL 10 MG PO TABS
10.0000 mg | ORAL_TABLET | Freq: Every day | ORAL | Status: DC
  Administered 2013-10-11 – 2013-10-12 (×2): 10 mg via ORAL

## 2013-10-11 MED ORDER — NON FORMULARY: 10.0000 mg | Freq: Every morning | Status: DC

## 2013-10-11 NOTE — Lactation Note (Signed)
This note was copied from the chart of Sandra Purvis [place]. Lactation Consultation Note Baby having poor feedings. Noted upper and lower labial frenulum to gum line. Limited tongue movement. Baby chews instead of sucks. Suck training w/gloved finger. Mom has large nipples. #24 NS applied and demonstrated. Gave #20 as well for later if has nipple changes. Hand pump given, shells given. Hand expression demonstrated w/colostrum noted. Comfort gels given d/t slightly sore from biting. Baby latched well w/nipple shield and mom states comfortable. Heard audible swallows. Encourage to get MD to look at baby's mouth.    Patient Name: Sandra Love Today's Date: 10/11/2013 Reason for consult: Difficult latch   Maternal Data    Feeding Length of feed: 15 min (still feeding)  LATCH Score/Interventions Latch: Grasps breast easily, tongue down, lips flanged, rhythmical sucking. Intervention(s): Skin to skin;Teach feeding cues;Waking techniques Intervention(s): Adjust position;Assist with latch;Breast massage;Breast compression  Audible Swallowing: A few with stimulation Intervention(s): Skin to skin;Hand expression Intervention(s): Skin to skin;Alternate breast massage;Hand expression  Type of Nipple: Everted at rest and after stimulation Intervention(s): Hand pump;Shells  Comfort (Breast/Nipple): Filling, red/small blisters or bruises, mild/mod discomfort  Problem noted: Mild/Moderate discomfort Interventions (Mild/moderate discomfort): Hand massage;Hand expression;Pre-pump if needed;Comfort gels;Breast shields  Hold (Positioning): Assistance needed to correctly position infant at breast and maintain latch. Intervention(s): Breastfeeding basics reviewed;Support Pillows;Position options;Skin to skin  LATCH Score: 7  Lactation Tools Discussed/Used Tools: Shells;Nipple Shields;Pump;Comfort gels Nipple shield size: 24 Shell Type: Inverted Breast pump type: Manual   Consult  Status Consult Status: Follow-up Date: 10/12/13 Follow-up type: In-patient    Charyl DancerCARVER, Amillion Macchia G 10/11/2013, 2:38 PM

## 2013-10-11 NOTE — Addendum Note (Signed)
Addendum created 10/11/13 4076 by Lincoln Brigham, CRNA   Modules edited: Notes Section   Notes Section:  File: [id number]

## 2013-10-11 NOTE — Anesthesia Postprocedure Evaluation (Signed)
Anesthesia Post Note  Patient: Sandra Love  Procedure(s) Performed: Procedure(s) (LRB): PRIMARY CESAREAN SECTION (N/A)  Anesthesia type: Spinal  Patient location: Mother/Baby  Post pain: Pain level controlled  Post assessment: Post-op Vital signs reviewed  Last Vitals:  Filed Vitals:   10/11/13 0515  BP: 99/63  Pulse: 69  Temp: 37 C  Resp: 18    Post vital signs: Reviewed  Level of consciousness:alert  Complications: No apparent anesthesia complications

## 2013-10-11 NOTE — Progress Notes (Signed)
Subjective: Postpartum Day 1: Cesarean Delivery Patient reports tolerating PO.    Objective: Vital signs in last 24 hours: Temp:  [97.7 F (36.5 C)-98.8 F (37.1 C)] 98.6 F (37 C) (07/11 0515) Pulse Rate:  [60-85] 69 (07/11 0515) Resp:  [16-21] 18 (07/11 0515) BP: (83-128)/(44-78) 99/63 mmHg (07/11 0515) SpO2:  [94 %-99 %] 96 % (07/11 0515)  Physical Exam:  General: alert and cooperative Lochia: appropriate Uterine Fundus: firm Incision: no significant drainage DVT Evaluation: No evidence of DVT seen on physical exam.   Recent Labs  10/09/13 1430 10/11/13 0550  HGB 12.3 10.4*  HCT 36.9 31.9*    Assessment/Plan: Status post Cesarean section. Doing well postoperatively.  Continue current care.  Meighan Treto 10/11/2013, 8:47 AM

## 2013-10-12 NOTE — Lactation Note (Signed)
This note was copied from the chart of Sandra Lutricia Hickson. Lactation Consultation Note     Brief follow up with this baby's and mom's nurse, donna E, that baby is very fussy during and after breast feeding. Mom was able to hand express 5 mls of colostrum which was fed to baby. I feel at this point, with probable tongue tie, and fussy baby, it would be advised that mom pump and bottle feed EBM, and add formula as supplementation as needed, according to the [facility] supplementation chart.   Patient Name: Sandra Love LTRVU'Y Date: 10/12/2013 Reason for consult: Follow-up assessment   Maternal Data    Feeding Feeding Type: Bottle Fed - Formula  LATCH Score/Interventions Latch: Repeated attempts needed to sustain latch, nipple held in mouth throughout feeding, stimulation needed to elicit sucking reflex. Intervention(s): Skin to skin Intervention(s): Adjust position;Assist with latch;Breast compression  Audible Swallowing: None (Ineffective suck, chewing, chomping on nipple) Intervention(s): Hand expression  Type of Nipple: Everted at rest and after stimulation Intervention(s): Hand pump  Comfort (Breast/Nipple): Filling, red/small blisters or bruises, mild/mod discomfort  Problem noted: Mild/Moderate discomfort  Hold (Positioning): No assistance needed to correctly position infant at breast. Intervention(s): Breastfeeding basics reviewed;Support Pillows;Position options;Skin to skin  LATCH Score: 6  Lactation Tools Discussed/Used Tools: Nipple Shields Nipple shield size: 24 Breast pump type: Double-Electric Breast Pump Pump Review: Setup, frequency, and cleaning;Milk Storage   Consult Status Consult Status: Follow-up Date: 10/13/13 Follow-up type: In-patient    Alfred Levins 10/12/2013, 4:27 PM

## 2013-10-12 NOTE — Lactation Note (Signed)
This note was copied from the chart of Sandra Love. Lactation Consultation Note    Follow up consult with this mom and baby, now 54 hours post partum, and full term. I saw mom earlier to day, and increased her to a 24 nipple shield, with a better fit.  I advised mom at that time, to increase her frequency of pumping to every 3 hours, and offer this EBm to the baby in her nipple shield. Mom had pumped 3 mls of transitional milk prior to this feeding. Mom independently applied 24 shield, and I filled shield with EBM. Baby latched well, and remainder of EBm fed into shield with curved tip syringe. Mom ins much better spirits  that this morning, after having a nap. Mom had pumped her right breast, and now had baby latched to her left. Strong suckles, rhythmcic  Suckles. Mom also aware she can bottle feed EBM with slow flow nipple, either before or after breast feeding. Mom knows to call for questions/concerns.Baby does have an upper lip frenulum that extend into her gum line, and a lingual short frenulum, buried in thick tissue, about a third back from the tip of her tongue.   Patient Name: Sandra Love ELTRV'U Date: 10/12/2013 Reason for consult: Follow-up assessment   Maternal Data    Feeding Feeding Type: Breast Milk  LATCH Score/Interventions Latch: Grasps breast easily, tongue down, lips flanged, rhythmical sucking. (with 24 nipple shiled filled with EBM) Intervention(s): Skin to skin;Teach feeding cues;Waking techniques Intervention(s): Adjust position;Assist with latch;Breast compression  Audible Swallowing: A few with stimulation Intervention(s): Hand expression  Type of Nipple: Everted at rest and after stimulation Intervention(s): Double electric pump  Comfort (Breast/Nipple): Filling, red/small blisters or bruises, mild/mod discomfort  Problem noted: Filling  Hold (Positioning): Assistance needed to correctly position infant at breast and maintain  latch. Intervention(s): Breastfeeding basics reviewed;Support Pillows;Position options;Skin to skin  LATCH Score: 7  Lactation Tools Discussed/Used Tools: Nipple Shields Nipple shield size: 24 Breast pump type: Double-Electric Breast Pump Pump Review: Setup, frequency, and cleaning;Milk Storage   Consult Status Consult Status: Follow-up Date: 10/13/13 Follow-up type: In-patient    Alfred Levins 10/12/2013, 2:29 PM

## 2013-10-13 ENCOUNTER — Encounter (HOSPITAL_COMMUNITY): Payer: Self-pay | Admitting: Obstetrics & Gynecology

## 2013-10-13 MED ORDER — IBUPROFEN 600 MG PO TABS: 600.0000 mg | ORAL_TABLET | Freq: Four times a day (QID) | ORAL | Status: AC

## 2013-10-13 MED ORDER — OXYCODONE-ACETAMINOPHEN 5-325 MG PO TABS: 1.0000 | ORAL_TABLET | ORAL | Status: AC | PRN

## 2013-10-13 NOTE — Discharge Summary (Signed)
Obstetric Discharge Summary Reason for Admission: cesarean section Prenatal Procedures: none Intrapartum Procedures: cesarean: low cervical, transverse Postpartum Procedures: none Complications-Operative and Postpartum: none Hemoglobin  Date Value Ref Range Status  10/11/2013 10.4* 12.0 - 15.0 g/dL Final     HCT  Date Value Ref Range Status  10/11/2013 31.9* 36.0 - 46.0 % Final    Physical Exam:  General: alert, cooperative and appears stated age Lochia: appropriate Uterine Fundus: firm Incision: healing well, no significant drainage, no dehiscence DVT Evaluation: No evidence of DVT seen on physical exam. Negative Homan's sign. No cords or calf tenderness.  Discharge Diagnoses: Term Pregnancy-delivered  Discharge Information: Date: 10/13/2013 Activity: pelvic rest Diet: routine Medications: PNV, Ibuprofen and Percocet Condition: stable Instructions: refer to practice specific booklet Discharge to: home   Newborn Data: Live born female  Birth Weight: 7 lb 11.5 oz (3500 g) APGAR: 8, 9  Home with mother.  Saud Bail 10/13/2013, 9:25 AM

## 2013-10-13 NOTE — Lactation Note (Addendum)
This note was copied from the chart of Sandra Love. Lactation Consultation Note  Patient Name: Sandra Love JSHFW'Y Date: 10/13/2013 Reason for consult: Follow-up assessment Per mom my nipples are still very sore, using a #24 NS with latch . LC assessed nipples with  Moms Permission , nipples pinky red , and tender with exam , left areola more compressible than the right. Breast are filling bilaterally . LC had mom apply nipple shield on the right nipple and noted the base to appear tight, Also noted some bruising top of nipple on right and left, LC recommended not using the nipple shield and try latching  Without . If to painful to pump both breast instead and feed from a broad based slow flow nipple. Then introduce the breast   when the sore ness improves , may need to give the baby what she has got'en used to and then latch with breast compressions. Mom mentioned the baby recently fed, baby resting in crib and still acting hungry. LC offered to assist with latch, mom receptive to assist. LC assisted with pillow support on the left breast , football position. Baby has a high palate and opens wide , with good tongue mobility, Latched with depth , sluggish at 1st , and then increased swallowing pattern with several swallows. Per mom comfortable. Baby fed for 7-8 mins, And released , nipple appeared normal shape . Transitional milk easily expressed off the breast , LC encouraged mom to use EBM on nipples. Mom post pumped for 7- 10 mins with 20 ml pumped off.  Sore nipple and engorgement prevention and tx discussed . Mom plans to rent a DEBP, due to waiting for insurance pump, with instruction.   LC instructed mom and dad on the use of the DEBP standard mode, #24 flange a good fit today, stressed to mom once milk comes  In if the #24 NS is to tight , to increase to #27 NS , when less full decrease to #24 flange. LC also recommended if soreness has not improved in 3-4 days to call [phone]  Lactation help line to schedule LC O/P apt. Mom and dad receptive to lactation plan of care.     Maternal Data Has patient been taught Hand Expression?: Yes  Feeding Feeding Type: Breast Fed Length of feed: 7 min (multiply swallows )  LATCH Score/Interventions Latch: Grasps breast easily, tongue down, lips flanged, rhythmical sucking. (left , football ) Intervention(s): Skin to skin;Teach feeding cues;Waking techniques Intervention(s): Adjust position;Assist with latch;Breast massage;Breast compression  Audible Swallowing: Spontaneous and intermittent  Type of Nipple: Everted at rest and after stimulation Intervention(s): Reverse pressure  Comfort (Breast/Nipple): Filling, red/small blisters or bruises, mild/mod discomfort  Problem noted: Filling;Mild/Moderate discomfort (pinky red , sore nipples , and areolas ) Interventions (Filling): Massage;Firm support;Reverse pressure Interventions (Mild/moderate discomfort): Reverse pressue;Hand massage;Hand expression;Pre-pump if needed;Comfort gels  Hold (Positioning): Assistance needed to correctly position infant at breast and maintain latch. Intervention(s): Breastfeeding basics reviewed;Support Pillows;Position options;Skin to skin  LATCH Score: 8  Lactation Tools Discussed/Used Tools: Shells (plans to rent a DEBP ) Nipple shield size: Other (comment) (resized Nipple shield and LC felt it was to tight , and suggested latching without ) Shell Type: Inverted WIC Program: No   Consult Status Consult Status: Follow-up (plans to rent a DEBP ) Date: 10/13/13 Follow-up type: In-patient    Kathrin Greathouse 10/13/2013, 11:22 AM

## 2013-10-13 NOTE — Discharge Instructions (Signed)
Call MD for T>100.4, heavy vaginal bleeding, severe abdominal pain, intractable nausea and/or vomiting, or respiratory distress.  Call office to schedule 1 week postop appointment for incision check.  No driving while taking narcotics.  Pelvic rest x 6 weeks.

## 2013-10-13 NOTE — Progress Notes (Signed)
Subjective: Postpartum Day 3: Cesarean Delivery Patient reports tolerating PO, + flatus and no problems voiding.    Objective: Vital signs in last 24 hours: Temp:  [98.4 F (36.9 C)] 98.4 F (36.9 C) (07/12 1818) Pulse Rate:  [71] 71 (07/12 1756) Resp:  [16] 16 (07/12 1756) BP: (115)/(58) 115/58 mmHg (07/12 1756)  Physical Exam:  General: alert, cooperative and appears stated age Lochia: appropriate Uterine Fundus: firm Incision: healing well, no significant drainage, no dehiscence DVT Evaluation: No evidence of DVT seen on physical exam. Negative Homan's sign. No cords or calf tenderness.   Recent Labs  10/11/13 0550  HGB 10.4*  HCT 31.9*    Assessment/Plan: Status post Cesarean section. Doing well postoperatively.  Discharge home with standard precautions and return to clinic in 4-6 weeks.  Sandra Love 10/13/2013, 9:21 AM

## 2013-10-13 NOTE — Progress Notes (Signed)
Pt given zyrtec bottle from pharmacy to take home. RN did not get pt to sign yellow sheet. Unaware of policy on home medications. Pt did receive at 1145 today.

## 2014-02-02 ENCOUNTER — Encounter (HOSPITAL_COMMUNITY): Payer: Self-pay | Admitting: Obstetrics & Gynecology

## 2015-04-23 ENCOUNTER — Other Ambulatory Visit: Payer: Self-pay | Admitting: Obstetrics & Gynecology

## 2015-04-23 DIAGNOSIS — N644 Mastodynia: Secondary | ICD-10-CM

## 2015-04-28 ENCOUNTER — Other Ambulatory Visit: Payer: Self-pay

## 2015-04-29 ENCOUNTER — Ambulatory Visit
Admission: RE | Admit: 2015-04-29 | Discharge: 2015-04-29 | Disposition: A | Discharge status: Home / Self Care | Payer: BLUE CROSS/BLUE SHIELD | Source: Ambulatory Visit | Attending: Obstetrics & Gynecology | Admitting: Obstetrics & Gynecology

## 2015-04-29 DIAGNOSIS — N644 Mastodynia: Secondary | ICD-10-CM

## 2015-09-06 LAB — OB RESULTS CONSOLE RUBELLA ANTIBODY, IGM: Rubella: IMMUNE

## 2015-09-06 LAB — OB RESULTS CONSOLE RPR: RPR: NONREACTIVE

## 2015-09-06 LAB — OB RESULTS CONSOLE GC/CHLAMYDIA
CHLAMYDIA, DNA PROBE: NEGATIVE
GC PROBE AMP, GENITAL: NEGATIVE

## 2015-09-06 LAB — OB RESULTS CONSOLE HIV ANTIBODY (ROUTINE TESTING): HIV: NONREACTIVE

## 2015-09-06 LAB — OB RESULTS CONSOLE ABO/RH: RH Type: POSITIVE

## 2015-09-06 LAB — OB RESULTS CONSOLE ANTIBODY SCREEN: ANTIBODY SCREEN: NEGATIVE

## 2015-09-06 LAB — OB RESULTS CONSOLE HEPATITIS B SURFACE ANTIGEN: HEP B S AG: NEGATIVE

## 2016-03-07 LAB — OB RESULTS CONSOLE GBS: GBS: NEGATIVE

## 2016-03-15 ENCOUNTER — Encounter (HOSPITAL_COMMUNITY): Payer: Self-pay | Admitting: *Deleted

## 2016-03-15 ENCOUNTER — Telehealth (HOSPITAL_COMMUNITY): Payer: Self-pay | Admitting: *Deleted

## 2016-03-15 NOTE — Telephone Encounter (Signed)
Preadmission screen  

## 2016-03-16 ENCOUNTER — Encounter (HOSPITAL_COMMUNITY): Payer: Self-pay

## 2016-03-18 NOTE — H&P (Signed)
Sandra Love is a 35 y.o. female presenting for repeat C/S.  The patient has h/o Crohn's disease s/p colectomy/ileostomy; serial growth u/s have shown normal growth.  The patient is AMA and declines aneuploidy testing.  GBS negatie.  OB History    Gravida Para Term Preterm AB Living   2 1 1     1    SAB TAB Ectopic Multiple Live Births           1     Past Medical History:  Diagnosis Date  . Anxiety   . Asthma   . Crohn disease (HCC)   . Eczema   . Vaginal Pap smear, abnormal    Past Surgical History:  Procedure Laterality Date  . CESAREAN SECTION N/A 10/10/2013   Procedure: PRIMARY CESAREAN SECTION;  Surgeon: Mitchel Honour, DO;  Location: [facility];  Service: Obstetrics;  Laterality: N/A;  spinal and epidural combo   . HERNIA REPAIR     RLQ VWH primary repair of old RLQ ileostomy site  . INCISIONAL HERNIA REPAIR  02/13/2012   Procedure: HERNIA REPAIR INCISIONAL;  Surgeon: Ardeth Sportsman, MD;  Location: [facility];  Service: General;  Laterality: N/A;  Reduction and repair of incarcerated incisional hernia  . INSERTION OF MESH  02/13/2012   Procedure: INSERTION OF MESH;  Surgeon: Ardeth Sportsman, MD;  Location: [facility];  Service: General;  Laterality: N/A;  . LAPAROSCOPY  02/13/2012   Procedure: LAPAROSCOPY DIAGNOSTIC;  Surgeon: Ardeth Sportsman, MD;  Location: [facility];  Service: General;;  . OSTOMY     Ileostomy relocation RLQ to LLQ  . SUBTOTAL COLECTOMY  2001   [facility] age 30   Family History: family history includes Cancer in her paternal grandfather; Hypertension in her father and maternal grandfather; Thyroid disease in her father and mother. Social History:  reports that she has never smoked. She has never used smokeless tobacco. She reports that she does not drink alcohol or use drugs.     Maternal Diabetes: No Genetic Screening: Declined Maternal Ultrasounds/Referrals: Normal Fetal Ultrasounds or other Referrals:  None Maternal Substance Abuse:  No Significant Maternal  Medications:  None Significant Maternal Lab Results:  Lab values include: Group B Strep negative Other Comments:  None  ROS Maternal Medical History:  Prenatal complications: no prenatal complications Prenatal Complications - Diabetes: none.      Last menstrual period 06/22/2015, unknown if currently breastfeeding. Maternal Exam:  Abdomen: Patient reports no abdominal tenderness. Surgical scars: low transverse.   Fundal height is c/w dates.   Estimated fetal weight is 7#12.       Physical Exam  Constitutional: She is oriented to person, place, and time. She appears well-developed and well-nourished.  GI: Soft. There is no rebound and no guarding.  Neurological: She is alert and oriented to person, place, and time.  Skin: Skin is warm and dry.  Psychiatric: She has a normal mood and affect. Her behavior is normal.    Prenatal labs: ABO, Rh: AB/Positive/-- (06/05 0000) Antibody: Negative (06/05 0000) Rubella: Immune (06/05 0000) RPR: Nonreactive (06/05 0000)  HBsAg: Negative (06/05 0000)  HIV: Non-reactive (06/05 0000)  GBS: Negative (12/05 0000)   Assessment/Plan: 35yo G2P1001 at 39 weeks with previous C/S, desires repeat -Rpt C/S-Patient has been counseled re: risk of bleeding, infection, scarring, and damage to surrounding structures.  All questions were answered and the patient wishes to proceed.  Sandra Love 03/18/2016, 8:35 AM

## 2016-03-21 ENCOUNTER — Encounter (HOSPITAL_COMMUNITY)
Admission: RE | Admit: 2016-03-21 | Discharge: 2016-03-21 | Disposition: A | Discharge status: Home / Self Care | Payer: Self-pay | Source: Ambulatory Visit | Attending: Obstetrics & Gynecology | Admitting: Obstetrics & Gynecology

## 2016-03-21 HISTORY — DX: Dermatitis, unspecified: L30.9

## 2016-03-21 HISTORY — DX: Unspecified abnormal cytological findings in specimens from vagina: R87.629

## 2016-03-21 LAB — CBC
HCT: 32.5 % — ABNORMAL LOW (ref 36.0–46.0)
Hemoglobin: 11 g/dL — ABNORMAL LOW (ref 12.0–15.0)
MCH: 27.4 pg (ref 26.0–34.0)
MCHC: 33.8 g/dL (ref 30.0–36.0)
MCV: 81 fL (ref 78.0–100.0)
Platelets: 297 10*3/uL (ref 150–400)
RBC: 4.01 MIL/uL (ref 3.87–5.11)
RDW: 13.9 % (ref 11.5–15.5)
WBC: 14.2 10*3/uL — AB (ref 4.0–10.5)

## 2016-03-21 LAB — TYPE AND SCREEN
ABO/RH(D): AB POS
ANTIBODY SCREEN: NEGATIVE

## 2016-03-21 MED ORDER — GENTAMICIN SULFATE 40 MG/ML IJ SOLN
INTRAMUSCULAR | Status: AC
  Administered 2016-03-22: 113 mL via INTRAVENOUS
  Filled 2016-03-21: qty 7.5

## 2016-03-21 NOTE — Patient Instructions (Signed)
[address]  03/21/2016   Your procedure is scheduled on:  03/22/2016  Enter through the Main Entrance of [facility] [phone].  Pick up the phone at the desk and dial [phone].   Call this number if you have problems the morning of surgery: [phone]   Remember:   Do not eat food:After Midnight.  Do not drink clear liquids: After Midnight.  Take these medicines the morning of surgery with A SIP OF WATER: bring inhaler, take zantac if you take it in the morning   Do not wear jewelry, make-up or nail polish.  Do not wear lotions, powders, or perfumes. Do not wear deodorant.  Do not shave 48 hours prior to surgery.  Do not bring valuables to the hospital.  [facility] is not   responsible for any belongings or valuables brought to the hospital.  Contacts, dentures or bridgework may not be worn into surgery.  Leave suitcase in the car. After surgery it may be brought to your room.  For patients admitted to the hospital, checkout time is 11:00 AM the day of              discharge.   Patients discharged the day of surgery will not be allowed to drive             home.  Name and phone number of your driver: na  Special Instructions:   N/A   Please read over the following fact sheets that you were given:   Surgical Site Infection Prevention

## 2016-03-22 ENCOUNTER — Encounter (HOSPITAL_COMMUNITY)
Admission: RE | Disposition: A | Discharge status: Home / Self Care | Payer: Self-pay | Source: Ambulatory Visit | Attending: Obstetrics & Gynecology

## 2016-03-22 ENCOUNTER — Encounter (HOSPITAL_COMMUNITY): Payer: Self-pay

## 2016-03-22 ENCOUNTER — Inpatient Hospital Stay (HOSPITAL_COMMUNITY)
Admission: RE | Admit: 2016-03-22 | Discharge: 2016-04-03 | DRG: 765 | Disposition: A | Discharge status: Home / Self Care | Payer: Self-pay | Source: Ambulatory Visit | Attending: Obstetrics & Gynecology | Admitting: Obstetrics & Gynecology

## 2016-03-22 ENCOUNTER — Inpatient Hospital Stay (HOSPITAL_COMMUNITY): Payer: Self-pay | Admitting: Anesthesiology

## 2016-03-22 DIAGNOSIS — Z0189 Encounter for other specified special examinations: Secondary | ICD-10-CM

## 2016-03-22 DIAGNOSIS — K219 Gastro-esophageal reflux disease without esophagitis: Secondary | ICD-10-CM | POA: Diagnosis present

## 2016-03-22 DIAGNOSIS — R112 Nausea with vomiting, unspecified: Secondary | ICD-10-CM

## 2016-03-22 DIAGNOSIS — K5010 Crohn's disease of large intestine without complications: Secondary | ICD-10-CM | POA: Diagnosis present

## 2016-03-22 DIAGNOSIS — O99344 Other mental disorders complicating childbirth: Secondary | ICD-10-CM | POA: Diagnosis present

## 2016-03-22 DIAGNOSIS — O9952 Diseases of the respiratory system complicating childbirth: Secondary | ICD-10-CM | POA: Diagnosis present

## 2016-03-22 DIAGNOSIS — O34219 Maternal care for unspecified type scar from previous cesarean delivery: Secondary | ICD-10-CM | POA: Diagnosis present

## 2016-03-22 DIAGNOSIS — Z9889 Other specified postprocedural states: Secondary | ICD-10-CM

## 2016-03-22 DIAGNOSIS — O9962 Diseases of the digestive system complicating childbirth: Principal | ICD-10-CM | POA: Diagnosis present

## 2016-03-22 DIAGNOSIS — Z91010 Allergy to peanuts: Secondary | ICD-10-CM

## 2016-03-22 DIAGNOSIS — K8020 Calculus of gallbladder without cholecystitis without obstruction: Secondary | ICD-10-CM | POA: Diagnosis present

## 2016-03-22 DIAGNOSIS — J45909 Unspecified asthma, uncomplicated: Secondary | ICD-10-CM | POA: Diagnosis present

## 2016-03-22 DIAGNOSIS — Z932 Ileostomy status: Secondary | ICD-10-CM

## 2016-03-22 DIAGNOSIS — O2662 Liver and biliary tract disorders in childbirth: Secondary | ICD-10-CM | POA: Diagnosis present

## 2016-03-22 DIAGNOSIS — Z9049 Acquired absence of other specified parts of digestive tract: Secondary | ICD-10-CM

## 2016-03-22 DIAGNOSIS — Z3A39 39 weeks gestation of pregnancy: Secondary | ICD-10-CM

## 2016-03-22 DIAGNOSIS — K56609 Unspecified intestinal obstruction, unspecified as to partial versus complete obstruction: Secondary | ICD-10-CM | POA: Diagnosis present

## 2016-03-22 DIAGNOSIS — Z98891 History of uterine scar from previous surgery: Secondary | ICD-10-CM

## 2016-03-22 DIAGNOSIS — Z880 Allergy status to penicillin: Secondary | ICD-10-CM

## 2016-03-22 DIAGNOSIS — K5090 Crohn's disease, unspecified, without complications: Secondary | ICD-10-CM | POA: Diagnosis present

## 2016-03-22 DIAGNOSIS — R140 Abdominal distension (gaseous): Secondary | ICD-10-CM

## 2016-03-22 DIAGNOSIS — F419 Anxiety disorder, unspecified: Secondary | ICD-10-CM | POA: Diagnosis present

## 2016-03-22 HISTORY — DX: Incisional hernia without obstruction or gangrene: K43.2

## 2016-03-22 LAB — RPR: RPR Ser Ql: NONREACTIVE

## 2016-03-22 SURGERY — Surgical Case
Anesthesia: Spinal

## 2016-03-22 MED ORDER — DIPHENHYDRAMINE HCL 50 MG/ML IJ SOLN
12.5000 mg | INTRAMUSCULAR | Status: DC | PRN
  Administered 2016-03-24 – 2016-03-29 (×12): 12.5 mg via INTRAVENOUS
  Filled 2016-03-22 (×12): qty 1

## 2016-03-22 MED ORDER — SCOPOLAMINE 1 MG/3DAYS TD PT72
MEDICATED_PATCH | TRANSDERMAL | Status: AC
  Administered 2016-03-22: 1.5 mg via TRANSDERMAL
  Filled 2016-03-22: qty 1

## 2016-03-22 MED ORDER — HYDROMORPHONE HCL 1 MG/ML IJ SOLN
0.5000 mg | INTRAMUSCULAR | Status: AC | PRN
  Administered 2016-03-22: 0.5 mg via INTRAVENOUS

## 2016-03-22 MED ORDER — MORPHINE SULFATE (PF) 0.5 MG/ML IJ SOLN
INTRAMUSCULAR | Status: DC | PRN
  Administered 2016-03-22: .2 mg via INTRATHECAL

## 2016-03-22 MED ORDER — KETOROLAC TROMETHAMINE 30 MG/ML IJ SOLN
30.0000 mg | Freq: Four times a day (QID) | INTRAMUSCULAR | Status: AC | PRN
  Administered 2016-03-22: 30 mg via INTRAMUSCULAR

## 2016-03-22 MED ORDER — HYDROMORPHONE HCL 1 MG/ML IJ SOLN
0.5000 mg | INTRAMUSCULAR | Status: DC | PRN
Start: 2016-03-22 — End: 2016-03-22
  Administered 2016-03-22: 0.5 mg via INTRAVENOUS
  Filled 2016-03-22: qty 1

## 2016-03-22 MED ORDER — FENTANYL CITRATE (PF) 100 MCG/2ML IJ SOLN
INTRAMUSCULAR | Status: DC | PRN
  Administered 2016-03-22: 10 ug via INTRATHECAL

## 2016-03-22 MED ORDER — TETANUS-DIPHTH-ACELL PERTUSSIS 5-2.5-18.5 LF-MCG/0.5 IM SUSP: 0.5000 mL | Freq: Once | INTRAMUSCULAR | Status: DC

## 2016-03-22 MED ORDER — NALBUPHINE HCL 10 MG/ML IJ SOLN
5.0000 mg | Freq: Once | INTRAMUSCULAR | Status: DC | PRN
  Filled 2016-03-22: qty 1

## 2016-03-22 MED ORDER — ONDANSETRON HCL 4 MG/2ML IJ SOLN
INTRAMUSCULAR | Status: AC
  Filled 2016-03-22: qty 2

## 2016-03-22 MED ORDER — NALBUPHINE HCL 10 MG/ML IJ SOLN
5.0000 mg | INTRAMUSCULAR | Status: DC | PRN
  Filled 2016-03-22: qty 1

## 2016-03-22 MED ORDER — NALOXONE HCL 2 MG/2ML IJ SOSY
1.0000 ug/kg/h | PREFILLED_SYRINGE | INTRAVENOUS | Status: DC | PRN
  Filled 2016-03-22: qty 2

## 2016-03-22 MED ORDER — ZOLPIDEM TARTRATE 5 MG PO TABS: 5.0000 mg | ORAL_TABLET | Freq: Every evening | ORAL | Status: DC | PRN

## 2016-03-22 MED ORDER — PHENYLEPHRINE 8 MG IN D5W 100 ML (0.08MG/ML) PREMIX OPTIME
INJECTION | INTRAVENOUS | Status: AC
  Filled 2016-03-22: qty 100

## 2016-03-22 MED ORDER — IBUPROFEN 600 MG PO TABS: 600.0000 mg | ORAL_TABLET | Freq: Four times a day (QID) | ORAL | Status: DC

## 2016-03-22 MED ORDER — KETOROLAC TROMETHAMINE 30 MG/ML IJ SOLN
INTRAMUSCULAR | Status: AC
  Administered 2016-03-22: 30 mg via INTRAMUSCULAR
  Filled 2016-03-22: qty 1

## 2016-03-22 MED ORDER — KETOROLAC TROMETHAMINE 30 MG/ML IJ SOLN
30.0000 mg | Freq: Four times a day (QID) | INTRAMUSCULAR | Status: AC | PRN
  Filled 2016-03-22: qty 1

## 2016-03-22 MED ORDER — ONDANSETRON HCL 4 MG/2ML IJ SOLN
INTRAMUSCULAR | Status: DC | PRN
  Administered 2016-03-22: 4 mg via INTRAVENOUS

## 2016-03-22 MED ORDER — MORPHINE SULFATE-NACL 0.5-0.9 MG/ML-% IV SOSY
PREFILLED_SYRINGE | INTRAVENOUS | Status: AC
  Filled 2016-03-22: qty 1

## 2016-03-22 MED ORDER — PRENATAL MULTIVITAMIN CH
1.0000 | ORAL_TABLET | Freq: Every day | ORAL | Status: DC
  Administered 2016-03-23: 1 via ORAL
  Filled 2016-03-22 (×2): qty 1

## 2016-03-22 MED ORDER — LACTATED RINGERS IV SOLN
Freq: Once | INTRAVENOUS | Status: AC
  Administered 2016-03-22: 13:00:00 via INTRAVENOUS

## 2016-03-22 MED ORDER — SIMETHICONE 80 MG PO CHEW
80.0000 mg | CHEWABLE_TABLET | Freq: Three times a day (TID) | ORAL | Status: DC
  Administered 2016-03-23 (×2): 80 mg via ORAL
  Filled 2016-03-22 (×3): qty 1

## 2016-03-22 MED ORDER — ONDANSETRON HCL 4 MG/2ML IJ SOLN: 4.0000 mg | Freq: Three times a day (TID) | INTRAMUSCULAR | Status: DC | PRN

## 2016-03-22 MED ORDER — LACTATED RINGERS IV SOLN
INTRAVENOUS | Status: DC
  Administered 2016-03-22 (×2): via INTRAVENOUS

## 2016-03-22 MED ORDER — OXYCODONE-ACETAMINOPHEN 5-325 MG PO TABS
1.0000 | ORAL_TABLET | ORAL | Status: DC | PRN
  Administered 2016-03-23 (×2): 1 via ORAL
  Filled 2016-03-22: qty 1

## 2016-03-22 MED ORDER — DIBUCAINE 1 % RE OINT: 1.0000 "application " | TOPICAL_OINTMENT | RECTAL | Status: DC | PRN

## 2016-03-22 MED ORDER — DIPHENHYDRAMINE HCL 25 MG PO CAPS
25.0000 mg | ORAL_CAPSULE | ORAL | Status: DC | PRN
  Administered 2016-03-23: 25 mg via ORAL
  Filled 2016-03-22 (×3): qty 1

## 2016-03-22 MED ORDER — ACETAMINOPHEN 325 MG PO TABS: 650.0000 mg | ORAL_TABLET | ORAL | Status: DC | PRN

## 2016-03-22 MED ORDER — NALOXONE HCL 0.4 MG/ML IJ SOLN: 0.4000 mg | INTRAMUSCULAR | Status: DC | PRN

## 2016-03-22 MED ORDER — LACTATED RINGERS IV SOLN: INTRAVENOUS | Status: DC

## 2016-03-22 MED ORDER — SENNOSIDES-DOCUSATE SODIUM 8.6-50 MG PO TABS
2.0000 | ORAL_TABLET | ORAL | Status: DC
  Filled 2016-03-22 (×2): qty 2

## 2016-03-22 MED ORDER — LACTATED RINGERS IV SOLN
INTRAVENOUS | Status: DC
  Administered 2016-03-23: 01:00:00 via INTRAVENOUS

## 2016-03-22 MED ORDER — MENTHOL 3 MG MT LOZG: 1.0000 | LOZENGE | OROMUCOSAL | Status: DC | PRN

## 2016-03-22 MED ORDER — BUPIVACAINE IN DEXTROSE 0.75-8.25 % IT SOLN
INTRATHECAL | Status: DC | PRN
  Administered 2016-03-22: 1.6 mL via INTRATHECAL

## 2016-03-22 MED ORDER — DIPHENHYDRAMINE HCL 25 MG PO CAPS: 25.0000 mg | ORAL_CAPSULE | Freq: Four times a day (QID) | ORAL | Status: DC | PRN

## 2016-03-22 MED ORDER — COCONUT OIL OIL: 1.0000 "application " | TOPICAL_OIL | Status: DC | PRN

## 2016-03-22 MED ORDER — SIMETHICONE 80 MG PO CHEW
80.0000 mg | CHEWABLE_TABLET | ORAL | Status: DC
  Administered 2016-03-23 – 2016-03-24 (×2): 80 mg via ORAL
  Filled 2016-03-22 (×3): qty 1

## 2016-03-22 MED ORDER — PHENYLEPHRINE 8 MG IN D5W 100 ML (0.08MG/ML) PREMIX OPTIME
INJECTION | INTRAVENOUS | Status: DC | PRN
  Administered 2016-03-22: 60 ug/min via INTRAVENOUS

## 2016-03-22 MED ORDER — SODIUM CHLORIDE 0.9 % IR SOLN
Status: DC | PRN
  Administered 2016-03-22: 1

## 2016-03-22 MED ORDER — OXYTOCIN 40 UNITS IN LACTATED RINGERS INFUSION - SIMPLE MED: 2.5000 [IU]/h | INTRAVENOUS | Status: AC

## 2016-03-22 MED ORDER — ALBUTEROL SULFATE (2.5 MG/3ML) 0.083% IN NEBU: 3.0000 mL | INHALATION_SOLUTION | Freq: Four times a day (QID) | RESPIRATORY_TRACT | Status: DC | PRN

## 2016-03-22 MED ORDER — WITCH HAZEL-GLYCERIN EX PADS: 1.0000 "application " | MEDICATED_PAD | CUTANEOUS | Status: DC | PRN

## 2016-03-22 MED ORDER — OXYTOCIN 10 UNIT/ML IJ SOLN
INTRAVENOUS | Status: DC | PRN
  Administered 2016-03-22: 40 [IU] via INTRAVENOUS

## 2016-03-22 MED ORDER — OXYTOCIN 10 UNIT/ML IJ SOLN
INTRAMUSCULAR | Status: AC
  Filled 2016-03-22: qty 4

## 2016-03-22 MED ORDER — SODIUM CHLORIDE 0.9% FLUSH: 3.0000 mL | INTRAVENOUS | Status: DC | PRN

## 2016-03-22 MED ORDER — MEPERIDINE HCL 25 MG/ML IJ SOLN: 6.2500 mg | INTRAMUSCULAR | Status: DC | PRN

## 2016-03-22 MED ORDER — IBUPROFEN 200 MG PO TABS
600.0000 mg | ORAL_TABLET | Freq: Four times a day (QID) | ORAL | Status: DC
  Administered 2016-03-23 – 2016-03-24 (×5): 600 mg via ORAL
  Filled 2016-03-22 (×2): qty 1
  Filled 2016-03-22: qty 3
  Filled 2016-03-22 (×5): qty 1

## 2016-03-22 MED ORDER — FENTANYL CITRATE (PF) 100 MCG/2ML IJ SOLN
INTRAMUSCULAR | Status: AC
  Filled 2016-03-22: qty 2

## 2016-03-22 MED ORDER — OXYCODONE-ACETAMINOPHEN 5-325 MG PO TABS
2.0000 | ORAL_TABLET | ORAL | Status: DC | PRN
  Administered 2016-03-23 – 2016-03-24 (×4): 2 via ORAL
  Filled 2016-03-22 (×6): qty 2

## 2016-03-22 MED ORDER — SCOPOLAMINE 1 MG/3DAYS TD PT72
1.0000 | MEDICATED_PATCH | Freq: Once | TRANSDERMAL | Status: DC
  Filled 2016-03-22: qty 1

## 2016-03-22 MED ORDER — SCOPOLAMINE 1 MG/3DAYS TD PT72
1.0000 | MEDICATED_PATCH | Freq: Once | TRANSDERMAL | Status: DC
  Administered 2016-03-22: 1.5 mg via TRANSDERMAL

## 2016-03-22 MED ORDER — SIMETHICONE 80 MG PO CHEW: 80.0000 mg | CHEWABLE_TABLET | ORAL | Status: DC | PRN

## 2016-03-22 SURGICAL SUPPLY — 33 items
BENZOIN TINCTURE PRP APPL 2/3 (GAUZE/BANDAGES/DRESSINGS) ×3 IMPLANT
CHLORAPREP W/TINT 26ML (MISCELLANEOUS) ×3 IMPLANT
CLAMP CORD UMBIL (MISCELLANEOUS) IMPLANT
CLOSURE STERI STRIP 1/2 X4 (GAUZE/BANDAGES/DRESSINGS) ×3 IMPLANT
CLOSURE WOUND 1/2 X4 (GAUZE/BANDAGES/DRESSINGS)
CLOTH BEACON ORANGE TIMEOUT ST (SAFETY) ×3 IMPLANT
DERMABOND ADVANCED (GAUZE/BANDAGES/DRESSINGS) ×2
DERMABOND ADVANCED .7 DNX12 (GAUZE/BANDAGES/DRESSINGS) ×1 IMPLANT
DRSG OPSITE POSTOP 4X10 (GAUZE/BANDAGES/DRESSINGS) ×3 IMPLANT
ELECT REM PT RETURN 9FT ADLT (ELECTROSURGICAL) ×3
ELECTRODE REM PT RTRN 9FT ADLT (ELECTROSURGICAL) ×1 IMPLANT
EXTRACTOR VACUUM KIWI (MISCELLANEOUS) ×3 IMPLANT
GLOVE BIO SURGEON STRL SZ 6 (GLOVE) ×3 IMPLANT
GLOVE BIOGEL PI IND STRL 6 (GLOVE) ×2 IMPLANT
GLOVE BIOGEL PI IND STRL 7.0 (GLOVE) ×1 IMPLANT
GLOVE BIOGEL PI INDICATOR 6 (GLOVE) ×4
GLOVE BIOGEL PI INDICATOR 7.0 (GLOVE) ×2
GOWN STRL REUS W/TWL LRG LVL3 (GOWN DISPOSABLE) ×6 IMPLANT
KIT ABG SYR 3ML LUER SLIP (SYRINGE) ×3 IMPLANT
NEEDLE HYPO 25X5/8 SAFETYGLIDE (NEEDLE) ×3 IMPLANT
NS IRRIG 1000ML POUR BTL (IV SOLUTION) ×3 IMPLANT
PACK C SECTION WH (CUSTOM PROCEDURE TRAY) ×3 IMPLANT
PAD OB MATERNITY 4.3X12.25 (PERSONAL CARE ITEMS) ×3 IMPLANT
PENCIL SMOKE EVAC W/HOLSTER (ELECTROSURGICAL) ×3 IMPLANT
STRIP CLOSURE SKIN 1/2X4 (GAUZE/BANDAGES/DRESSINGS) IMPLANT
SUT CHROMIC 0 CTX 36 (SUTURE) ×12 IMPLANT
SUT MON AB 2-0 CT1 27 (SUTURE) ×3 IMPLANT
SUT PDS AB 0 CT1 27 (SUTURE) IMPLANT
SUT PLAIN 0 NONE (SUTURE) IMPLANT
SUT VIC AB 0 CT1 36 (SUTURE) IMPLANT
SUT VIC AB 4-0 KS 27 (SUTURE) IMPLANT
TOWEL OR 17X24 6PK STRL BLUE (TOWEL DISPOSABLE) ×3 IMPLANT
TRAY FOLEY CATH SILVER 14FR (SET/KITS/TRAYS/PACK) IMPLANT

## 2016-03-22 NOTE — Lactation Note (Signed)
This note was copied from a baby's chart. Lactation Consultation Note  Patient Name: Sandra Love [id number] Date: 03/22/2016 Reason for consult: Initial assessment   Initial assessment with Exp BF mom of 1 hour old infant. Infant STS with mom and was sleepy, attempted to latch him and he fell asleep. Mom reports she attempted to BF him earlier and he was biting her. She reports she BF her first child " for a long time" " much longer than I expected to" She did not give me a time line.   Enc mom to feed infant STS 8-12 x in 24 hours. BF basics, positioning, hand expression reviewed. Mom with compressible breasts and areola and everted nipples. She was able to hand express colostrum. Feeding log given with instructions for use.   Bf Resources Handout and LC Brochure given, mom informed of IP/OP Services, BF Support Groups and LC phone #. Enc mom to call out to desk for feeding assistance as needed.       Maternal Data Formula Feeding for Exclusion: No Has patient been taught Hand Expression?: Yes Does the patient have breastfeeding experience prior to this delivery?: Yes  Feeding Feeding Type: Breast Fed Length of feed: 0 min  LATCH Score/Interventions Latch: Too sleepy or reluctant, no latch achieved, no sucking elicited. Intervention(s): Skin to skin;Teach feeding cues;Waking techniques  Audible Swallowing: None  Type of Nipple: Everted at rest and after stimulation  Comfort (Breast/Nipple): Soft / non-tender     Hold (Positioning): Assistance needed to correctly position infant at breast and maintain latch. Intervention(s): Breastfeeding basics reviewed;Support Pillows;Position options;Skin to skin  LATCH Score: 5  Lactation Tools Discussed/Used WIC Program: No   Consult Status Consult Status: Follow-up Date: 03/23/16 Follow-up type: In-patient    Sandra Love 03/22/2016, 4:31 PM

## 2016-03-22 NOTE — Addendum Note (Signed)
Addendum  created 03/22/16 1836 by Heather Roberts, MD   Order list changed

## 2016-03-22 NOTE — Progress Notes (Signed)
[facility]  Delivery Note:  C-section       03/22/2016  2:43 PM  I was called to the operating r oom at the request of the patient's obstetrician (Dr. Morris) for a repeat c-section.  PRENATAL HX:  This is a 35 y/o G2P1001 at 39 and 1/[redacted] weeks gestation who presents for scheduled repeat c-section.  Pregnancy has been complicated by h/o Crohn's disease s/p colectomy/ileostomy; serial growth u/s have shown normal growth.  The patient is AMA.   INTRAPARTUM HX:   Repeat c-section with AROM at delivery  DELIVERY:  Infant was vigorous at delivery, requiring no resuscitation other than standard warming, drying and stimulation.  APGARs 9 and 9.  Exam within normal limits.  After 5 minutes, baby left with nurse to assist parents with skin-to-skin care.   _____________________ Electronically Signed By: Rhylie Stehr, MD Neonatologist  

## 2016-03-22 NOTE — Progress Notes (Signed)
No change to H&P.  Toshio Slusher, DO 

## 2016-03-22 NOTE — Transfer of Care (Signed)
Immediate Anesthesia Transfer of Care Note  Patient: Sandra Love  Procedure(s) Performed: Procedure(s) with comments: CESAREAN SECTION (N/A) - Repeat edc 12/26/174 allergic to peanuts, PCN  Patient Location: PACU  Anesthesia Type:Spinal  Level of Consciousness: awake  Airway & Oxygen Therapy: Patient Spontanous Breathing  Post-op Assessment: Report given to RN  Post vital signs: Reviewed and stable  Last Vitals:  Vitals:   03/22/16 1155  BP: 119/70  Pulse: 91  Resp: 16  Temp: 36.7 C    Last Pain:  Vitals:   03/22/16 1155  TempSrc: Oral      Patients Stated Pain Goal: 4 (03/22/16 1155)  Complications: No apparent anesthesia complications

## 2016-03-22 NOTE — Anesthesia Preprocedure Evaluation (Addendum)
Anesthesia Evaluation  Patient identified by MRN, date of birth, ID band Patient awake    Reviewed: Allergy & Precautions, H&P , NPO status , Patient's Chart, lab work & pertinent test results  History of Anesthesia Complications Negative for: history of anesthetic complications  Airway Mallampati: II  TM Distance: >3 FB Neck ROM: full    Dental  (+) Teeth Intact   Pulmonary asthma (seasonal - rare inhaler use) ,    breath sounds clear to auscultation       Cardiovascular negative cardio ROS   Rhythm:regular Rate:Normal     Neuro/Psych Anxiety negative neurological ROS     GI/Hepatic Neg liver ROS, GERD  Medicated,LLQ ileostomy Crohn's disease   Endo/Other  negative endocrine ROS  Renal/GU negative Renal ROS     Musculoskeletal   Abdominal   Peds  Hematology negative hematology ROS (+)   Anesthesia Other Findings   Reproductive/Obstetrics (+) Pregnancy (breech, primary C/S, h/o 5-6 abdominal surgeries for Crohn's disease and ventral hernia repairs with mesh, current ileostomy)                             Lab Results  Component Value Date   WBC 14.2 (H) 03/21/2016   HGB 11.0 (L) 03/21/2016   HCT 32.5 (L) 03/21/2016   MCV 81.0 03/21/2016   PLT 297 03/21/2016   Lab Results  Component Value Date   CREATININE 0.50 02/12/2012   BUN 9 02/12/2012   NA 137 02/12/2012   K 3.9 02/12/2012   CL 99 02/12/2012   CO2 28 02/12/2012    Anesthesia Physical  Anesthesia Plan  ASA: II  Anesthesia Plan: Spinal   Post-op Pain Management:    Induction:   Airway Management Planned: Natural Airway  Additional Equipment:   Intra-op Plan:   Post-operative Plan:   Informed Consent: I have reviewed the patients History and Physical, chart, labs and discussed the procedure including the risks, benefits and alternatives for the proposed anesthesia with the patient or authorized  representative who has indicated his/her understanding and acceptance.     Plan Discussed with: CRNA  Anesthesia Plan Comments:         Anesthesia Quick Evaluation

## 2016-03-22 NOTE — Anesthesia Postprocedure Evaluation (Signed)
Anesthesia Post Note  Patient: Sandra Love  Procedure(s) Performed: Procedure(s) (LRB): CESAREAN SECTION (N/A)  Patient location during evaluation: Mother Baby Anesthesia Type: Spinal Level of consciousness: awake and alert Pain management: pain level controlled Vital Signs Assessment: post-procedure vital signs reviewed and stable Respiratory status: spontaneous breathing Cardiovascular status: blood pressure returned to baseline Postop Assessment: no headache, patient able to bend at knees, no backache, no signs of nausea or vomiting and spinal receding        Last Vitals:  Vitals:   03/22/16 1940 03/22/16 2020  BP:  (!) 104/51  Pulse:  72  Resp: 18 18  Temp:  37.4 C    Last Pain:  Vitals:   03/22/16 2020  TempSrc: Oral  PainSc: (P) 2    Pain Goal: Patients Stated Pain Goal: 3 (pt. requesting extra 0.5mg  of dilaudid IV.) (03/22/16 1940)               Salome ArntSterling, Barney Gertsch Marie

## 2016-03-22 NOTE — Anesthesia Postprocedure Evaluation (Signed)
Anesthesia Post Note  Patient: Sandra Love  Procedure(s) Performed: Procedure(s) (LRB): CESAREAN SECTION (N/A)  Patient location during evaluation: PACU Anesthesia Type: Spinal Level of consciousness: awake and alert Pain management: pain level controlled Vital Signs Assessment: post-procedure vital signs reviewed and stable Respiratory status: spontaneous breathing and respiratory function stable Cardiovascular status: blood pressure returned to baseline and stable Postop Assessment: spinal receding Anesthetic complications: no        Last Vitals:  Vitals:   03/22/16 1700 03/22/16 1704  BP:  (!) 114/59  Pulse: 76 73  Resp: 17 18  Temp:  36.6 C    Last Pain:  Vitals:   03/22/16 1704  TempSrc: Axillary   Pain Goal: Patients Stated Pain Goal: 4 (03/22/16 1155)               Kennieth RadFitzgerald, Khyla Mccumbers E

## 2016-03-22 NOTE — Op Note (Signed)
Gardiner Barefoot PROCEDURE DATE: 03/22/2016  PREOPERATIVE DIAGNOSIS: Intrauterine pregnancy at  [redacted]w[redacted]d weeks gestation  POSTOPERATIVE DIAGNOSIS: The same  PROCEDURE:    Low Transverse Cesarean Section  SURGEON:  Dr. Mitchel Honour   INDICATIONS: Sandra Love is a 35 y.o. G2P1001 at [redacted]w[redacted]d scheduled for cesarean section secondary to desired repeat.  The risks of cesarean section discussed with the patient included but were not limited to: bleeding which may require transfusion or reoperation; infection which may require antibiotics; injury to bowel, bladder, ureters or other surrounding organs; injury to the fetus; need for additional procedures including hysterectomy in the event of a life-threatening hemorrhage; placental abnormalities wth subsequent pregnancies, incisional problems, thromboembolic phenomenon and other postoperative/anesthesia complications. The patient concurred with the proposed plan, giving informed written consent for the procedure.    FINDINGS:  Viable female infant in transverse (back down) presentation, APGARs 9,9:  Weight pending.  Clear amniotic fluid.  Intact placenta, three vessel cord.  Grossly normal uterus, ovaries and fallopian tubes. .   ANESTHESIA: Spinal ESTIMATED BLOOD LOSS: 600 ml SPECIMENS: Placenta sent to L&D COMPLICATIONS: None immediate  PROCEDURE IN DETAIL:  The patient received intravenous antibiotics and had sequential compression devices applied to her lower extremities while in the preoperative area.  She was then taken to the operating room where spinal anesthesia was administered and was found to be adequate. She was then placed in a dorsal supine position with a leftward tilt, and prepped and draped in a sterile manner.  A foley catheter was placed into her bladder and attached to constant gravity.  After an adequate timeout was performed, a Pfannenstiel skin incision was made with scalpel and carried through to the underlying layer of fascia. The  fascia was incised in the midline and this incision was extended bilaterally using the Mayo scissors. Kocher clamps were applied to the superior aspect of the fascial incision and the underlying rectus muscles were dissected off bluntly. A similar process was carried out on the inferior aspect of the facial incision. The rectus muscles were separated in the midline bluntly and the peritoneum was entered bluntly.   A transverse hysterotomy was made with a scalpel and extended bilaterally bluntly. The bladder blade was then removed. The infant was successfully delivered using Kiwi vacuum assist with no pop-offs, and cord was clamped and cut and infant was handed over to awaiting neonatology team. Uterine massage was then administered and the placenta delivered intact with three-vessel cord. The uterus was cleared of clot and debris.  The hysterotomy was closed with 0 chromic.  A second imbricating suture of 0-chromic was used to reinforce the incision and aid in hemostasis.  The peritoneum and rectus muscles were noted to be hemostatic and were reapproximated using 4-0 monocryl in a running fashion.  The fascia was closed with 0-PDS in a running fashion with good restoration of anatomy.  The subcutaneus tissue was copiously irrigated.  The skin was closed with 4-0 Vicryl in a subcuticular fashion.  Pt tolerated the procedure will.  All counts were correct x2.  Pt went to the recovery room in stable condition.

## 2016-03-23 LAB — BIRTH TISSUE RECOVERY COLLECTION (PLACENTA DONATION)

## 2016-03-23 LAB — CBC
HEMATOCRIT: 30.1 % — AB (ref 36.0–46.0)
HEMOGLOBIN: 9.9 g/dL — AB (ref 12.0–15.0)
MCH: 26.6 pg (ref 26.0–34.0)
MCHC: 32.9 g/dL (ref 30.0–36.0)
MCV: 80.9 fL (ref 78.0–100.0)
Platelets: 304 10*3/uL (ref 150–400)
RBC: 3.72 MIL/uL — AB (ref 3.87–5.11)
RDW: 14 % (ref 11.5–15.5)
WBC: 13.5 10*3/uL — ABNORMAL HIGH (ref 4.0–10.5)

## 2016-03-23 NOTE — Lactation Note (Signed)
This note was copied from a baby's chart. Lactation Consultation Note  Patient Name: Sandra Love [id number] Date: 03/23/2016 Reason for consult: Follow-up assessment Mom called out for latch assist on right.  She states baby does well on left but some difficulty on right.  Positioned baby in football hold on right.  Baby awake and crying.  Demonstrated to mom and FOB how to compress breast tissue for deep latch.  Baby latched easily and deep.  Reviewed waking techniques and breast massage.  Encouraged to call with concerns/assist.  Maternal Data    Feeding Feeding Type: Breast Fed Length of feed: 20 min  LATCH Score/Interventions Latch: Grasps breast easily, tongue down, lips flanged, rhythmical sucking. Intervention(s): Skin to skin;Teach feeding cues;Waking techniques Intervention(s): Breast compression;Breast massage;Assist with latch;Adjust position  Audible Swallowing: A few with stimulation Intervention(s): Skin to skin;Hand expression;Alternate breast massage  Type of Nipple: Everted at rest and after stimulation  Comfort (Breast/Nipple): Soft / non-tender     Hold (Positioning): Assistance needed to correctly position infant at breast and maintain latch. Intervention(s): Breastfeeding basics reviewed;Support Pillows;Position options;Skin to skin  LATCH Score: 8  Lactation Tools Discussed/Used     Consult Status Consult Status: Follow-up Date: 03/24/16 Follow-up type: In-patient    Huston Foley 03/23/2016, 11:49 AM

## 2016-03-23 NOTE — Progress Notes (Signed)
UR chart review completed.  

## 2016-03-23 NOTE — Progress Notes (Signed)
Subjective: Postpartum Day 1: Cesarean Delivery Patient reports tolerating PO and + flatus.    Objective: Vital signs in last 24 hours: Temp:  [97.8 F (36.6 C)-99.4 F (37.4 C)] 98.3 F (36.8 C) (12/21 0330) Pulse Rate:  [63-91] 68 (12/21 0330) Resp:  [12-20] 18 (12/21 0330) BP: (96-119)/(42-78) 109/42 (12/21 0330) SpO2:  [97 %-100 %] 98 % (12/20 2330)  Physical Exam:  General: alert and cooperative Lochia: appropriate Uterine Fundus: firm Incision: healing well, no significant drainage DVT Evaluation: No evidence of DVT seen on physical exam. Negative Homan's sign. No cords or calf tenderness. No significant calf/ankle edema.   Recent Labs  03/21/16 1025 03/23/16 0538  HGB 11.0* 9.9*  HCT 32.5* 30.1*    Assessment/Plan: Status post Cesarean section. Doing well postoperatively.  Continue current care.  CURTIS,CAROL G 03/23/2016, 7:44 AM

## 2016-03-24 ENCOUNTER — Encounter (HOSPITAL_COMMUNITY): Payer: Self-pay | Admitting: Radiology

## 2016-03-24 ENCOUNTER — Inpatient Hospital Stay (HOSPITAL_COMMUNITY): Payer: Self-pay

## 2016-03-24 LAB — COMPREHENSIVE METABOLIC PANEL
ALK PHOS: 103 U/L (ref 38–126)
ALT: 25 U/L (ref 14–54)
ANION GAP: 9 (ref 5–15)
AST: 23 U/L (ref 15–41)
Albumin: 2.9 g/dL — ABNORMAL LOW (ref 3.5–5.0)
BUN: 9 mg/dL (ref 6–20)
CALCIUM: 8.5 mg/dL — AB (ref 8.9–10.3)
CO2: 24 mmol/L (ref 22–32)
Chloride: 102 mmol/L (ref 101–111)
Creatinine, Ser: 0.39 mg/dL — ABNORMAL LOW (ref 0.44–1.00)
GLUCOSE: 118 mg/dL — AB (ref 65–99)
POTASSIUM: 3.6 mmol/L (ref 3.5–5.1)
Sodium: 135 mmol/L (ref 135–145)
TOTAL PROTEIN: 7.3 g/dL (ref 6.5–8.1)
Total Bilirubin: 0.5 mg/dL (ref 0.3–1.2)

## 2016-03-24 LAB — CBC
HEMATOCRIT: 35.2 % — AB (ref 36.0–46.0)
HEMOGLOBIN: 11.8 g/dL — AB (ref 12.0–15.0)
MCH: 26.9 pg (ref 26.0–34.0)
MCHC: 33.5 g/dL (ref 30.0–36.0)
MCV: 80.4 fL (ref 78.0–100.0)
Platelets: 355 10*3/uL (ref 150–400)
RBC: 4.38 MIL/uL (ref 3.87–5.11)
RDW: 14.2 % (ref 11.5–15.5)
WBC: 20.4 10*3/uL — ABNORMAL HIGH (ref 4.0–10.5)

## 2016-03-24 MED ORDER — ONDANSETRON HCL 4 MG PO TABS
8.0000 mg | ORAL_TABLET | Freq: Once | ORAL | Status: DC
  Filled 2016-03-24 (×2): qty 2

## 2016-03-24 MED ORDER — PHENOL 1.4 % MT LIQD
1.0000 | OROMUCOSAL | Status: DC | PRN
  Administered 2016-03-24: 1 via OROMUCOSAL
  Filled 2016-03-24: qty 177

## 2016-03-24 MED ORDER — KETOROLAC TROMETHAMINE 30 MG/ML IJ SOLN
30.0000 mg | Freq: Four times a day (QID) | INTRAMUSCULAR | Status: AC | PRN
  Administered 2016-03-24 – 2016-03-29 (×19): 30 mg via INTRAVENOUS
  Filled 2016-03-24 (×19): qty 1

## 2016-03-24 MED ORDER — IOPAMIDOL (ISOVUE-300) INJECTION 61%
100.0000 mL | Freq: Once | INTRAVENOUS | Status: AC | PRN
  Administered 2016-03-24: 100 mL via INTRAVENOUS

## 2016-03-24 MED ORDER — ONDANSETRON HCL 4 MG PO TABS
4.0000 mg | ORAL_TABLET | Freq: Four times a day (QID) | ORAL | Status: DC | PRN
  Administered 2016-03-24: 4 mg via ORAL
  Filled 2016-03-24: qty 1

## 2016-03-24 MED ORDER — LACTATED RINGERS IV BOLUS (SEPSIS)
500.0000 mL | Freq: Once | INTRAVENOUS | Status: AC
  Administered 2016-03-24: 500 mL via INTRAVENOUS

## 2016-03-24 MED ORDER — LACTATED RINGERS IV SOLN
INTRAVENOUS | Status: DC
  Administered 2016-03-24 – 2016-03-28 (×10): via INTRAVENOUS

## 2016-03-24 MED ORDER — ONDANSETRON HCL 4 MG/2ML IJ SOLN
4.0000 mg | Freq: Four times a day (QID) | INTRAMUSCULAR | Status: DC | PRN
  Administered 2016-03-24 – 2016-03-30 (×7): 4 mg via INTRAVENOUS
  Filled 2016-03-24 (×7): qty 2

## 2016-03-24 MED ORDER — PANTOPRAZOLE SODIUM 40 MG IV SOLR
40.0000 mg | Freq: Once | INTRAVENOUS | Status: AC
  Administered 2016-03-24: 40 mg via INTRAVENOUS
  Filled 2016-03-24: qty 40

## 2016-03-24 NOTE — Progress Notes (Signed)
Had nausea when trying to get out of bed-now S/P Zofran IV  Vitals:   03/24/16 0552 03/24/16 1230  BP: 118/74 118/67  Pulse: 77 79  Resp: 18 19  Temp: 98.7 F (37.1 C) 98.2 F (36.8 C)   Lungs CTA Abdomen some distention , few BS Dressing OK  A/P: AAS now         D/W patient         CBC in am

## 2016-03-24 NOTE — Progress Notes (Signed)
Upon nurse rounds at 0530 and medication administration patient has c/o nausea that has been on and off throughout the night after taking the percocet. Nurse calls provider to change IV zofran to oral. Telephone verbal order to change route of administration.  8295 Zofran 4mg  oral given to patient. Patient calls out about 10 minutes later having vomited. 350 ml undigested food noted - patient states she feels a little better now. VSS. No increase in vaginal bleeding. Incision CDI. No c/o dizziness or lightheadedness. Will continue to monitor.

## 2016-03-24 NOTE — Consult Note (Signed)
Subjective:   HPI  The patient is a 35 year old female who is 2 days status post C-section. She has a history of Crohn's disease and 20 years ago underwent a total colectomy with ileostomy. She has seen Dr. Ewing Schlein in the past. She was on 6-MP postop for her Crohn's disease but stopped it in 2004. She has not had any problems related to her Crohn's since her surgery. We are asked to see her in regards to nausea and vomiting which began yesterday. A CT scan showed dilation of the small bowel which measured up to 5.1 cm in diameter with multiple air-fluid levels findings were compatible with bowel obstruction etiology of which is unclear, but possibilities could be adhesions, Crohn's disease, and also she had been taking narcotic analgesics after her C-section which could slow motility of the bowel. In looking back at records she did have surgery by Dr. gross in 2013 with lysis of adhesions.  Review of Systems No chest pain or shortness of breath. No prior symptoms to suggest activity of Crohn's disease she is in no distress    Past Medical History:  Diagnosis Date  . Anxiety   . Asthma   . Crohn disease (HCC)   . Eczema   . Vaginal Pap smear, abnormal    Past Surgical History:  Procedure Laterality Date  . CESAREAN SECTION N/A 10/10/2013   Procedure: PRIMARY CESAREAN SECTION;  Surgeon: Mitchel Honour, DO;  Location: [facility];  Service: Obstetrics;  Laterality: N/A;  spinal and epidural combo   . CESAREAN SECTION N/A 03/22/2016   Procedure: CESAREAN SECTION;  Surgeon: Mitchel Honour, DO;  Location: [facility];  Service: Obstetrics;  Laterality: N/A;  Repeat edc 12/26/174 allergic to peanuts, PCN  . HERNIA REPAIR     RLQ VWH primary repair of old RLQ ileostomy site  . INCISIONAL HERNIA REPAIR  02/13/2012   Procedure: HERNIA REPAIR INCISIONAL;  Surgeon: Ardeth Sportsman, MD;  Location: [facility];  Service: General;  Laterality: N/A;  Reduction and repair of incarcerated incisional hernia  .  INSERTION OF MESH  02/13/2012   Procedure: INSERTION OF MESH;  Surgeon: Ardeth Sportsman, MD;  Location: [facility];  Service: General;  Laterality: N/A;  . LAPAROSCOPY  02/13/2012   Procedure: LAPAROSCOPY DIAGNOSTIC;  Surgeon: Ardeth Sportsman, MD;  Location: [facility];  Service: General;;  . OSTOMY     Ileostomy relocation RLQ to LLQ  . SUBTOTAL COLECTOMY  22   [facility] age 49   Social History   Social History  . Marital status: Married    Spouse name: N/A  . Number of children: N/A  . Years of education: N/A   Occupational History  . 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 Smoking status: Never Smoker  . Smokeless tobacco: Never Used  . Alcohol use No  . Drug use: No  . Sexual activity: Yes    Birth control/ protection: None   Other Topics Concern  . Not on file   Social History Narrative  . No narrative on file   family history includes Cancer in her paternal grandfather; Hypertension in her father and maternal grandfather; Thyroid disease in her father and mother.  Current Facility-Administered Medications:  .  acetaminophen (TYLENOL) tablet 650 mg, 650 mg, Oral, Q4H PRN, Megan Morris, DO .  albuterol (PROVENTIL) (2.5 MG/3ML) 0.083% nebulizer solution 3 mL, 3 mL, Inhalation, Q6H PRN, Megan Morris, DO .  coconut oil, 1 application, Topical, PRN, Mitchel HonourMegan Morris, DO .  witch hazel-glycerin (TUCKS) pad 1 application, 1 application, Topical, PRN **AND** dibucaine (NUPERCAINAL) 1 % rectal ointment 1 application, 1 application, Rectal, PRN, Megan Morris, DO .  diphenhydrAMINE (BENADRYL) injection 12.5 mg, 12.5 mg, Intravenous, Q4H PRN **OR** diphenhydrAMINE (BENADRYL) capsule 25 mg, 25 mg, Oral, Q4H PRN, Marcene Duosobert Fitzgerald, MD, 25 mg at 03/23/16 0432 .  diphenhydrAMINE (BENADRYL) capsule 25 mg, 25 mg, Oral, Q6H PRN, Megan Morris, DO .  ibuprofen (ADVIL,MOTRIN) tablet 600 mg, 600 mg, Oral, Q6H, Megan Morris, DO, 600 mg at 03/24/16 0540 .   ketorolac (TORADOL) 30 MG/ML injection 30 mg, 30 mg, Intravenous, Q6H PRN, Harold HedgeJames Tomblin, MD, 30 mg at 03/24/16 1712 .  lactated ringers infusion, , Intravenous, Continuous, Megan Morris, DO, Last Rate: 125 mL/hr at 03/23/16 0048 .  lactated ringers infusion, , Intravenous, Continuous, Harold HedgeJames Tomblin, MD, Last Rate: 125 mL/hr at 03/24/16 1636 .  menthol-cetylpyridinium (CEPACOL) lozenge 3 mg, 1 lozenge, Oral, Q2H PRN, Megan Morris, DO .  nalbuphine (NUBAIN) injection 5 mg, 5 mg, Intravenous, Q4H PRN **OR** nalbuphine (NUBAIN) injection 5 mg, 5 mg, Subcutaneous, Q4H PRN, Marcene Duosobert Fitzgerald, MD .  nalbuphine (NUBAIN) injection 5 mg, 5 mg, Intravenous, Once PRN **OR** nalbuphine (NUBAIN) injection 5 mg, 5 mg, Subcutaneous, Once PRN, Marcene Duosobert Fitzgerald, MD .  naloxone [facility](NARCAN) 2 mg in dextrose 5 % 250 mL infusion, 1-4 mcg/kg/hr, Intravenous, Continuous PRN, Marcene Duosobert Fitzgerald, MD .  naloxone [facility](NARCAN) injection 0.4 mg, 0.4 mg, Intravenous, PRN **AND** sodium chloride flush (NS) 0.9 % injection 3 mL, 3 mL, Intravenous, PRN, Marcene Duosobert Fitzgerald, MD .  ondansetron [facility](ZOFRAN) injection 4 mg, 4 mg, Intravenous, Q6H PRN, Harold HedgeJames Tomblin, MD, 4 mg at 03/24/16 1331 .  ondansetron (ZOFRAN) tablet 4 mg, 4 mg, Oral, Q6H PRN, Ranae PilaElise Jennifer Leger, MD, 4 mg at 03/24/16 0617 .  ondansetron (ZOFRAN) tablet 8 mg, 8 mg, Oral, Once, Ranae PilaElise Jennifer Leger, MD .  oxyCODONE-acetaminophen (PERCOCET/ROXICET) 5-325 MG per tablet 1 tablet, 1 tablet, Oral, Q4H PRN, Mitchel HonourMegan Morris, DO, 1 tablet at 03/23/16 0432 .  oxyCODONE-acetaminophen (PERCOCET/ROXICET) 5-325 MG per tablet 2 tablet, 2 tablet, Oral, Q4H PRN, Mitchel HonourMegan Morris, DO, 2 tablet at 03/24/16 0056 .  phenol (CHLORASEPTIC) mouth spray 1 spray, 1 spray, Mouth/Throat, PRN, Edson SnowballBrooke A Miller, PA-C, 1 spray at 03/24/16 1740 .  prenatal multivitamin tablet 1 tablet, 1 tablet, Oral, Q1200, Megan Morris, DO, 1 tablet at 03/23/16 1151 .  scopolamine (TRANSDERM-SCOP) 1 MG/3DAYS 1.5 mg, 1 patch,  Transdermal, Once, Marcene Duosobert Fitzgerald, MD .  senna-docusate (Senokot-S) tablet 2 tablet, 2 tablet, Oral, Q24H, Megan Morris, DO .  simethicone (MYLICON) chewable tablet 80 mg, 80 mg, Oral, TID PC, Megan Morris, DO, 80 mg at 03/23/16 1715 .  simethicone (MYLICON) chewable tablet 80 mg, 80 mg, Oral, Q24H, Megan Morris, DO, 80 mg at 03/24/16 0020 .  simethicone (MYLICON) chewable tablet 80 mg, 80 mg, Oral, PRN, Mitchel HonourMegan Morris, DO .  Tdap (BOOSTRIX) injection 0.5 mL, 0.5 mL, Intramuscular, Once, Megan Morris, DO .  zolpidem (AMBIEN) tablet 5 mg, 5 mg, Oral, QHS PRN, Mitchel HonourMegan Morris, DO Allergies  Allergen Reactions  . Penicillins Hives    Has patient had a PCN reaction causing immediate rash, facial/tongue/throat swelling, SOB or lightheadedness with hypotension: no Has patient had a PCN reaction causing severe rash involving mucus membranes or skin necrosis: yes Has patient had a PCN reaction that required hospitalization no Has patient had a PCN reaction occurring within the last 10 years:  no If all of the above answers are "NO", then may proceed with Cephalosporin use.      Objective:     BP 120/63   Pulse 71   Temp 98.3 F (36.8 C) (Oral)   Resp 18   LMP 06/22/2015   SpO2 98%   Breastfeeding? Unknown   No distress  NG tube in place  Heart regular rhythm no murmurs  Lungs clear  Abdomen: Distention, soft, generalized tenderness, ileostomy, no rebound pain  Laboratory No components found for: D1    Assessment:     Small bowel obstruction  The etiology of this I think is most likely secondary to adhesions. Crohn's disease is certainly a possibility however she has been feeling fine all these years with no symptoms to suggest activity of Crohn's disease. Another possibility of the bowel the location could be related to the fact that she has been using narcotic analgesics.      Plan:     I would recommend for the current plan to be NG suction, and IV hydration with avoidance  of narcotic analgesics. I would give this a day or 2 and see if her symptoms resolve. If her symptoms resolve then I think we were dealing with adhesions or dilated bowel from narcotic analgesics. If they do not resolve then I would suggest adding IV steroids just in case it could be Crohn's. If then the symptoms did not resolve I would ask surgery to consider exploratory laparotomy for lysis of adhesions again. We will follow.

## 2016-03-24 NOTE — Progress Notes (Signed)
POD #2  C/O N/V since about 2am, little if any fluids retained since then. Yesterday was feeling good, tolerating regular diet, voiding, ambulating. States ileostomy output OK Had a lot of reflux in pregnancy  Vitals:   03/23/16 1955 03/24/16 0552  BP: 110/61 118/74  Pulse: 74 77  Resp: 18 18  Temp: 98.5 F (36.9 C) 98.7 F (37.1 C)   Patient vomiting Skin W&D Lungs CTA Cor RRR Abdomen ?of slight distention?, few BS Dressing C&D  A/P: N/V secondary to possible reflux or post op ileus        IV fluids, Protonix, follow UO        Labs         Observation

## 2016-03-24 NOTE — Lactation Note (Signed)
This note was copied from a baby's chart. Lactation Consultation Note  Patient Name: Boy Lavette Hafen [id number]'[id number] Date: 03/24/2016  Follow up visit made.  Mom is not feeling well and is not going to be discharged today due to N/V.  Mom states baby is feeding well.  No concerns at present.  Encouraged to call with concerns/assist.   Maternal Data    Feeding Feeding Type: Breast Fed Length of feed: 30 min (off and on)  LATCH Score/Interventions Latch: Grasps breast easily, tongue down, lips flanged, rhythmical sucking. Intervention(s): Skin to skin Intervention(s): Adjust position;Assist with latch  Audible Swallowing: Spontaneous and intermittent  Type of Nipple: Everted at rest and after stimulation  Comfort (Breast/Nipple): Soft / non-tender     Hold (Positioning): Assistance needed to correctly position infant at breast and maintain latch.  LATCH Score: 9  Lactation Tools Discussed/Used     Consult Status      Huston Foley 03/24/2016, 12:07 PM

## 2016-03-24 NOTE — Progress Notes (Signed)
No N/V with NG in place  Reviewed with patient and husband progress to now.  A/P: NG suction         IV fluids         Labs in am

## 2016-03-24 NOTE — Consult Note (Signed)
[facility] Surgery Consult Note  ZALIAH WISSNER 1980-04-13  [id number].    Requesting MD: Gaetano Net Chief Complaint/Reason for Consult: SBO  HPI:  Sandra Love is a 35yo female PMH Crohns with h/o total colectomy and end ileostomy (initially RLQ and later moved to LLQ). She is 2 days s/p scheduled cesarean section. Did well POD1 and was tolerating a regular diet. On POD2 she began to have gradually worsening abdominal pain associated with nausea and 5 episodes of emesis. She is also complaining of abdominal distension. Her pain is global about the abdomen but most severe on the left side. It is constant but worse with movement. She has had no air or stool from ileostomy today. XR was concerning for a SBO. CT scan more suggestive of active Crohns disease. Last meal last night.  PMH significant for Crohns Abdominal surgical history includes 2 laparotomies adding up to a total abdominal colectomy, Ileostomy initially RLQ and later relocated to LLQ, Primary right parastomal hernia repair, cesarean section x2 Denies home use of anticoagulants at home Nonsmoker  ROS: Review of Systems  Constitutional: Positive for chills. Negative for fever.  Eyes: Negative.   Respiratory: Negative.   Cardiovascular: Negative.   Gastrointestinal: Positive for abdominal pain, constipation, nausea and vomiting. Negative for diarrhea and heartburn.  Genitourinary: Negative.   Skin: Negative.     All systems reviewed and otherwise negative except for as above  Family History  Problem Relation Age of Onset  . Thyroid disease Mother   . Hypertension Father   . Thyroid disease Father   . Hypertension Maternal Grandfather   . Cancer Paternal Grandfather     colon and stomach    Past Medical History:  Diagnosis Date  . Anxiety   . Asthma   . Crohn disease ([place])   . Eczema   . Vaginal Pap smear, abnormal     Past Surgical History:  Procedure Laterality Date  . CESAREAN SECTION N/A 10/10/2013    Procedure: PRIMARY CESAREAN SECTION;  Surgeon: Linda Hedges, DO;  Location: [facility];  Service: Obstetrics;  Laterality: N/A;  spinal and epidural combo   . CESAREAN SECTION N/A 03/22/2016   Procedure: CESAREAN SECTION;  Surgeon: Linda Hedges, DO;  Location: [facility];  Service: Obstetrics;  Laterality: N/A;  Repeat edc 12/26/174 allergic to peanuts, PCN  . HERNIA REPAIR     RLQ [place] primary repair of old RLQ ileostomy site  . INCISIONAL HERNIA REPAIR  02/13/2012   Procedure: HERNIA REPAIR INCISIONAL;  Surgeon: Adin Hector, MD;  Location: [facility];  Service: General;  Laterality: N/A;  Reduction and repair of incarcerated incisional hernia  . INSERTION OF MESH  02/13/2012   Procedure: INSERTION OF MESH;  Surgeon: Adin Hector, MD;  Location: [facility];  Service: General;  Laterality: N/A;  . LAPAROSCOPY  02/13/2012   Procedure: LAPAROSCOPY DIAGNOSTIC;  Surgeon: Adin Hector, MD;  Location: [facility];  Service: General;;  . OSTOMY     Ileostomy relocation RLQ to LLQ  . SUBTOTAL COLECTOMY  69   Elliott age 17    Social History:  reports that she has never smoked. She has never used smokeless tobacco. She reports that she does not drink alcohol or use drugs.  Allergies:  Allergies  Allergen Reactions  . Penicillins Hives    Has patient had a PCN reaction causing immediate rash, facial/tongue/throat swelling, SOB or lightheadedness with hypotension: no Has patient had a PCN reaction causing severe rash involving mucus  membranes or skin necrosis: yes Has patient had a PCN reaction that required hospitalization no Has patient had a PCN reaction occurring within the last 10 years: no If all of the above answers are "NO", then may proceed with Cephalosporin use.     Medications Prior to Admission  Medication Sig Dispense Refill  . albuterol (PROVENTIL HFA;VENTOLIN HFA) 108 (90 BASE) MCG/ACT inhaler Inhale 1-2 puffs into the lungs every 6 (six) hours as needed (for allergies).     . cetirizine (ZYRTEC) 10 MG tablet Take 10 mg by mouth daily.    . Multiple Vitamin (MULTIVITAMIN WITH MINERALS) TABS tablet Take 1 tablet by mouth daily.    . ranitidine (ZANTAC) 150 MG tablet Take 150 mg by mouth 2 (two) times daily.    Marland Kitchen ibuprofen (ADVIL,MOTRIN) 600 MG tablet Take 1 tablet (600 mg total) by mouth every 6 (six) hours. (Patient not taking: Reported on 03/08/2016) 30 tablet 0  . oxyCODONE-acetaminophen (PERCOCET/ROXICET) 5-325 MG per tablet Take 1-2 tablets by mouth every 4 (four) hours as needed for severe pain (moderate - severe pain). (Patient not taking: Reported on 03/08/2016) 30 tablet 0    Blood pressure 111/61, pulse 68, temperature 98.3 F (36.8 C), temperature source Oral, resp. rate 18, last menstrual period 06/22/2015, SpO2 98 %, unknown if currently breastfeeding. Physical Exam: General: pleasant, WD/WN white female who is laying in bed in NAD HEENT: head is normocephalic, atraumatic.  Sclera are noninjected.  Mouth is dry Heart: regular, rate, and rhythm.  No obvious murmurs, gallops, or rubs noted.  Palpable pedal pulses bilaterally Lungs: CTAB, no wheezes, rhonchi, or rales noted.  Respiratory effort nonlabored Abd: low transverse incision cdi, well healed RLQ and midline incisions, soft, moderately distended L>R, hypoactive BS. No peritonitis. Ileostomy LLQ with bag in place, no air or stool in bag MS: all 4 extremities are symmetrical with no cyanosis, clubbing, or edema. Skin: warm and dry with no masses, lesions, or rashes Psych: A&Ox3 with an appropriate affect. Neuro: CM 2-12 intact, extremity CSM intact bilaterally, normal speech  Results for orders placed or performed during the hospital encounter of 03/22/16 (from the past 48 hour(s))  CBC     Status: Abnormal   Collection Time: 03/23/16  5:38 AM  Result Value Ref Range   WBC 13.5 (H) 4.0 - 10.5 K/uL   RBC 3.72 (L) 3.87 - 5.11 MIL/uL   Hemoglobin 9.9 (L) 12.0 - 15.0 g/dL   HCT 30.1 (L) 36.0 - 46.0  %   MCV 80.9 78.0 - 100.0 fL   MCH 26.6 26.0 - 34.0 pg   MCHC 32.9 30.0 - 36.0 g/dL   RDW 14.0 11.5 - 15.5 %   Platelets 304 150 - 400 K/uL  Collect bld for placenta donatation     Status: None   Collection Time: 03/23/16  5:39 AM  Result Value Ref Range   Placenta donation bld collect COLLECTED BY LABORATORY   CBC     Status: Abnormal   Collection Time: 03/24/16  8:44 AM  Result Value Ref Range   WBC 20.4 (H) 4.0 - 10.5 K/uL   RBC 4.38 3.87 - 5.11 MIL/uL   Hemoglobin 11.8 (L) 12.0 - 15.0 g/dL   HCT 35.2 (L) 36.0 - 46.0 %   MCV 80.4 78.0 - 100.0 fL   MCH 26.9 26.0 - 34.0 pg   MCHC 33.5 30.0 - 36.0 g/dL   RDW 14.2 11.5 - 15.5 %   Platelets 355 150 - 400 K/uL  Comprehensive metabolic panel     Status: Abnormal   Collection Time: 03/24/16  8:44 AM  Result Value Ref Range   Sodium 135 135 - 145 mmol/L   Potassium 3.6 3.5 - 5.1 mmol/L   Chloride 102 101 - 111 mmol/L   CO2 24 22 - 32 mmol/L   Glucose, Bld 118 (H) 65 - 99 mg/dL   BUN 9 6 - 20 mg/dL   Creatinine, Ser 0.39 (L) 0.44 - 1.00 mg/dL   Calcium 8.5 (L) 8.9 - 10.3 mg/dL   Total Protein 7.3 6.5 - 8.1 g/dL   Albumin 2.9 (L) 3.5 - 5.0 g/dL   AST 23 15 - 41 U/L   ALT 25 14 - 54 U/L   Alkaline Phosphatase 103 38 - 126 U/L   Total Bilirubin 0.5 0.3 - 1.2 mg/dL   GFR calc non Af Amer >60 >60 mL/min   GFR calc Af Amer >60 >60 mL/min    Comment: (NOTE) The eGFR has been calculated using the CKD EPI equation. This calculation has not been validated in all clinical situations. eGFR's persistently <60 mL/min signify possible Chronic Kidney Disease.    Anion gap 9 5 - 15   Dg Abd Acute W/chest  Result Date: 03/24/2016 CLINICAL DATA:  Postoperative nausea and vomiting. C-section 03/22/2016. History of Crohn's disease with colectomy and ileostomy. EXAM: DG ABDOMEN ACUTE W/ 1V CHEST COMPARISON:  CT abdomen pelvis 02/12/2012 FINDINGS: The lungs are clear. Negative for infiltrate or effusion. Heart size upper normal Small amount of  pneumoperitoneum under the right hemidiaphragm. This may be related to the patient's recent C-section 2 days ago. The small bowel is diffusely dilated with numerous air-fluid levels. Colon surgically absent. Small bowel does not appear to be thickened. No acute skeletal abnormality. IMPRESSION: No active cardiopulmonary abnormality Small-bowel obstruction pattern. Small amount of pneumoperitoneum consistent with recent C-section. Surgical consultation recommended. CT abdomen pelvis may be helpful for further evaluation. These results were called by telephone at the time of interpretation on 03/24/2016 at 3:08 pm to Dr. Everlene Farrier , who verbally acknowledged these results. Electronically Signed   By: Franchot Gallo M.D.   On: 03/24/2016 15:08     Assessment/Plan SBO - PMH Crohns with h/o colectomy and end ileostomy (initially RLQ and later moved to LLQ) - 2 days s/p scheduled cesarean section patient began to have n/v, abdominal distension, and no output from ileostomy - XR concerning for SBO - CT scan suggests active Crohn's disease over SBO - WBC 20.4, afebrile  Plan - I saw and evaluated this patient with Dr. Marlou Starks. Recommend NG tube for decompression and NPO. Recommend GI consult for recommendations of treatment of Crohns. Repeat XR in AM  Jerrye Beavers, [facility] Surgery 03/24/2016, 3:39 PM Pager: [phone] Consults: [phone] Mon-Fri 7:00 am-4:30 pm Sat-Sun 7:00 am-11:30 am

## 2016-03-24 NOTE — Progress Notes (Signed)
Nursing:   16 Fr. NG tube placed in L nare.  1st attempt, pt gagged and coughed, then vomited x 3, clear/yellow mucous phlegm colored emisis.  Gave pt a period of 10 min. Rest period, and repeated with pt calmly swallowing during procedure.  Tube placed to suction with 75cc phlegm clear output upon insertion.  Pt tolerated 2nd attempt well.  Adelfa Lozito T. Rubye Oaks RN

## 2016-03-25 ENCOUNTER — Inpatient Hospital Stay (HOSPITAL_COMMUNITY): Payer: Self-pay

## 2016-03-25 LAB — COMPREHENSIVE METABOLIC PANEL
ALBUMIN: 2.6 g/dL — AB (ref 3.5–5.0)
ALK PHOS: 89 U/L (ref 38–126)
ALT: 22 U/L (ref 14–54)
ANION GAP: 5 (ref 5–15)
AST: 18 U/L (ref 15–41)
BILIRUBIN TOTAL: 0.5 mg/dL (ref 0.3–1.2)
BUN: 17 mg/dL (ref 6–20)
CALCIUM: 8.1 mg/dL — AB (ref 8.9–10.3)
CO2: 31 mmol/L (ref 22–32)
Chloride: 103 mmol/L (ref 101–111)
Creatinine, Ser: 0.52 mg/dL (ref 0.44–1.00)
GFR calc non Af Amer: >60 mL/min (ref >60)
GLUCOSE: 101 mg/dL — AB (ref 65–99)
POTASSIUM: 3.6 mmol/L (ref 3.5–5.1)
SODIUM: 139 mmol/L (ref 135–145)
TOTAL PROTEIN: 6.7 g/dL (ref 6.5–8.1)

## 2016-03-25 LAB — CBC
HCT: 35.1 % — ABNORMAL LOW (ref 36.0–46.0)
HEMOGLOBIN: 11.4 g/dL — AB (ref 12.0–15.0)
MCH: 26.4 pg (ref 26.0–34.0)
MCHC: 32.5 g/dL (ref 30.0–36.0)
MCV: 81.3 fL (ref 78.0–100.0)
Platelets: 384 10*3/uL (ref 150–400)
RBC: 4.32 MIL/uL (ref 3.87–5.11)
RDW: 14.4 % (ref 11.5–15.5)
WBC: 14.7 10*3/uL — ABNORMAL HIGH (ref 4.0–10.5)

## 2016-03-25 NOTE — Progress Notes (Signed)
[facility] Progress Note  Subjective: Her abdomen feels less distended today. Denies further nausea or vomiting. NG tube still suctioning out. She has not passed flatus.  Objective: Vital signs in last 24 hours: Temp:  [98.1 F (36.7 C)-98.4 F (36.9 C)] 98.1 F (36.7 C) (12/23 0900) Pulse Rate:  [68-80] 73 (12/23 0900) Resp:  [16-19] 18 (12/23 0900) BP: (105-121)/(55-70) 105/55 (12/23 0900) SpO2:  [96 %-98 %] 98 % (12/23 0900) Weight change:    PE:  No distress  Abdomen: No bowel sounds heard at this time, soft, mild tenderness  Abdominal x-ray somewhat better but still consistent with either ileus or small bowel obstruction  Lab Results: Results for orders placed or performed during the hospital encounter of 03/22/16 (from the past 24 hour(s))  CBC     Status: Abnormal   Collection Time: 03/25/16  5:12 AM  Result Value Ref Range   WBC 14.7 (H) 4.0 - 10.5 K/uL   RBC 4.32 3.87 - 5.11 MIL/uL   Hemoglobin 11.4 (L) 12.0 - 15.0 g/dL   HCT 16.135.1 (L) 09.636.0 - 04.546.0 %   MCV 81.3 78.0 - 100.0 fL   MCH 26.4 26.0 - 34.0 pg   MCHC 32.5 30.0 - 36.0 g/dL   RDW 40.914.4 81.111.5 - 91.415.5 %   Platelets 384 150 - 400 K/uL  Comprehensive metabolic panel     Status: Abnormal   Collection Time: 03/25/16  5:12 AM  Result Value Ref Range   Sodium 139 135 - 145 mmol/L   Potassium 3.6 3.5 - 5.1 mmol/L   Chloride 103 101 - 111 mmol/L   CO2 31 22 - 32 mmol/L   Glucose, Bld 101 (H) 65 - 99 mg/dL   BUN 17 6 - 20 mg/dL   Creatinine, Ser 7.820.52 0.44 - 1.00 mg/dL   Calcium 8.1 (L) 8.9 - 10.3 mg/dL   Total Protein 6.7 6.5 - 8.1 g/dL   Albumin 2.6 (L) 3.5 - 5.0 g/dL   AST 18 15 - 41 U/L   ALT 22 14 - 54 U/L   Alkaline Phosphatase 89 38 - 126 U/L   Total Bilirubin 0.5 0.3 - 1.2 mg/dL   GFR calc non Af Amer >60 >60 mL/min   GFR calc Af Amer >60 >60 mL/min   Anion gap 5 5 - 15    Studies/Results: Ct Abdomen Pelvis W Contrast  Result Date: 03/24/2016 CLINICAL DATA:  35 year old female with  nausea and vomiting. History of Crohn's disease status post hernia repair. Evaluate for small bowel obstruction. History of C-section on 03/22/2016. EXAM: CT ABDOMEN AND PELVIS WITH CONTRAST TECHNIQUE: Multidetector CT imaging of the abdomen and pelvis was performed using the standard protocol following bolus administration of intravenous contrast. CONTRAST:  100mL ISOVUE-300 IOPAMIDOL (ISOVUE-300) INJECTION 61% COMPARISON:  CT the abdomen and pelvis 02/12/2012. FINDINGS: Lower chest: Unremarkable. Hepatobiliary: No cystic or solid hepatic lesions. No intra or extrahepatic biliary ductal dilatation. Several calcified gallstones are noted lying dependently in the gallbladder. Gallbladder does not appear distended, and the gallbladder wall does not appear thickened to suggest acute cholecystitis. Pancreas: No pancreatic mass. No pancreatic ductal dilatation. No pancreatic or peripancreatic fluid or inflammatory changes. Spleen: Unremarkable. Adrenals/Urinary Tract: Probable 1-2 mm nonobstructive calculus in the interpolar collecting system of the right kidney. Kidneys and bilateral adrenal glands are otherwise normal in appearance. No hydroureteronephrosis. Urinary bladder is normal in appearance. Stomach/Bowel: The stomach is partially decompressed, and otherwise unremarkable in appearance. The proximal small bowel, including the proximal aspect  of the jejunum appear completely decompressed. There are multiple dilated loops of small bowel throughout the abdomen measuring up to 5.1 cm in diameter, with multiple air-fluid levels. Several of these bowel loops appear slightly thickened despite being dilated, with associated hypervascularity in their mesentery, indicative of areas of active inflammation. Multiple other areas of significant bowel wall thickening are noted, best appreciated on coronal images 35 of series 602 in the right-sided the abdomen, and image 40 of series 602 in the central upper abdomen. Patient is  status post total colectomy with a Hartmann's pouch and left lower quadrant ileostomy. The bowel leading up to the ileostomy site is poorly depicted on today's examination. Vascular/Lymphatic: No significant atherosclerotic disease or definite aneurysm identified in the abdominal or pelvic vasculature. No lymphadenopathy is noted in the abdomen or pelvis. Reproductive: Post gravid uterus remains enlarged and extends slightly out of the anatomic pelvis into the lower right side of the abdomen. Ovaries are unremarkable in appearance. Other: Small volume of ascites. Small amount of pneumoperitoneum. Gas in the anterior abdominal wall musculature and the overlying subcutaneous fat, centered around the level of the C-section incision, and in close proximity to the ileostomy. Musculoskeletal: There are no aggressive appearing lytic or blastic lesions noted in the visualized portions of the skeleton. IMPRESSION: 1. Small amount of pneumoperitoneum and ascites, as well as intramuscular and subcutaneous gas in the anterior abdominal wall, strongly favored to all be related to recent C-section. 2. There is dilatation of the small bowel which measures up to 5.1 cm in diameter, with multiple air-fluid levels. While the possibility of bowel obstruction is not excluded, it is not strongly favored, particularly in light of the decompressed stomach and proximal jejunum/duodenum. Rather, findings are favored to be related to active Crohn's disease given multiple segmental areas of mild narrowing, bowel wall thickening and hypervascularity in the mesenteric. 3. Cholelithiasis without evidence to suggest an acute cholecystitis at this time. 4. Probable 1-2 mm nonobstructive calculus in the interpolar collecting system of the right kidney. These results were called by telephone at the time of interpretation on 03/24/2016 at 5:04 pm to Dr. Harold Hedge, who verbally acknowledged these results. Electronically Signed   By: Trudie Reed M.D.   On: 03/24/2016 17:04   Dg Abd Acute W/chest  Result Date: 03/24/2016 CLINICAL DATA:  Postoperative nausea and vomiting. C-section 03/22/2016. History of Crohn's disease with colectomy and ileostomy. EXAM: DG ABDOMEN ACUTE W/ 1V CHEST COMPARISON:  CT abdomen pelvis 02/12/2012 FINDINGS: The lungs are clear. Negative for infiltrate or effusion. Heart size upper normal Small amount of pneumoperitoneum under the right hemidiaphragm. This may be related to the patient's recent C-section 2 days ago. The small bowel is diffusely dilated with numerous air-fluid levels. Colon surgically absent. Small bowel does not appear to be thickened. No acute skeletal abnormality. IMPRESSION: No active cardiopulmonary abnormality Small-bowel obstruction pattern. Small amount of pneumoperitoneum consistent with recent C-section. Surgical consultation recommended. CT abdomen pelvis may be helpful for further evaluation. These results were called by telephone at the time of interpretation on 03/24/2016 at 3:08 pm to Dr. Harold Hedge , who verbally acknowledged these results. Electronically Signed   By: Marlan Palau M.D.   On: 03/24/2016 15:08   Dg Abd Portable 1v  Result Date: 03/25/2016 CLINICAL DATA:  Abdominal distention, pain EXAM: PORTABLE ABDOMEN - 1 VIEW COMPARISON:  03/24/2016 FINDINGS: Enteric tube terminates in the mid gastric body. Mildly dilated loops of small bowel in the left mid abdomen, possibly  reflecting adynamic ileus, small bowel obstruction not excluded. Left lower quadrant ostomy. IMPRESSION: Enteric tube terminates in the mid gastric body. Mildly dilated loops of small bowel in the left mid abdomen, possibly reflecting adynamic ileus, small bowel obstruction not excluded. This is better evaluated on recent CT, when small bowel obstruction was not favored. Electronically Signed   By: Charline Bills M.D.   On: 03/25/2016 09:49   Dg Abd Portable 1v  Result Date: 03/24/2016 CLINICAL  DATA:  NG tube placement EXAM: PORTABLE ABDOMEN - 1 VIEW COMPARISON:  03/24/2016 FINDINGS: Minimal atelectasis at the left base. Esophageal tube is looped upon itself and the tip overlies the left upper abdomen, presumably mid stomach. Residual contrast material is present within the renal collecting systems with mild hydronephrosis on the right. Multiple loops of dilated small bowel in the central abdomen. IMPRESSION: Esophageal tube tip is looped upon itself in the left upper quadrant, the tip superimposes the mid stomach Residual contrast material within the collecting system of the kidneys with suggestion of mild right hydronephrosis Multiple loops of dilated small bowel suggestive of obstruction Electronically Signed   By: Jasmine Pang M.D.   On: 03/24/2016 19:08      Assessment: Small bowel obstruction versus ileus. Etiology could be adhesions, Crohn's, recent use of narcotic analgesics    Plan:   Continue NG suction and IV hydration. Follow clinically.    SAM F Emmery Seiler 03/25/2016, 12:00 PM  Pager: [phone] If no answer or after 5 PM call [phone]

## 2016-03-25 NOTE — Plan of Care (Signed)
Problem: Life Cycle: Goal: Chance of risk for complications during the postpartum period will decrease Outcome: Progressing Pt VSS; able to void when stand pivot from bed to commode; remains hypoactive in both upper quadrants of abdomen, present bowel sounds in both lower quadrants - denies passing gas or gross amounts of stool at this time. NG placement confirmed at 0000 with auscultation - flushed with 40ml saline due to patient's c/o nausea and 1 episode of vomiting. Pt resting comfortably at this time (0215)  Problem: Pain Management: Goal: General experience of comfort will improve and pain level will decrease Outcome: Progressing Toradol 30mg  IV given q6h per patient request. Keeping patient pain level tolerable.

## 2016-03-25 NOTE — Progress Notes (Signed)
POD #3  No N/V since last pm-much more comfortable Good pain relief over incision Feels less distended in upper abdomen No significant ostomy output  Vitals:   03/24/16 2338 03/25/16 0422  BP: (!) 114/56 112/61  Pulse: 80 78  Resp: 16 16  Temp: 98.4 F (36.9 C) 98.1 F (36.7 C)   Lungs CTA Abdomen-soft, BS+   UO 1650 NG 200  Results for orders placed or performed during the hospital encounter of 03/22/16 (from the past 24 hour(s))  CBC     Status: Abnormal   Collection Time: 03/25/16  5:12 AM  Result Value Ref Range   WBC 14.7 (H) 4.0 - 10.5 K/uL   RBC 4.32 3.87 - 5.11 MIL/uL   Hemoglobin 11.4 (L) 12.0 - 15.0 g/dL   HCT 62.8 (L) 36.6 - 29.4 %   MCV 81.3 78.0 - 100.0 fL   MCH 26.4 26.0 - 34.0 pg   MCHC 32.5 30.0 - 36.0 g/dL   RDW 76.5 46.5 - 03.5 %   Platelets 384 150 - 400 K/uL  Comprehensive metabolic panel     Status: Abnormal   Collection Time: 03/25/16  5:12 AM  Result Value Ref Range   Sodium 139 135 - 145 mmol/L   Potassium 3.6 3.5 - 5.1 mmol/L   Chloride 103 101 - 111 mmol/L   CO2 31 22 - 32 mmol/L   Glucose, Bld 101 (H) 65 - 99 mg/dL   BUN 17 6 - 20 mg/dL   Creatinine, Ser 4.65 0.44 - 1.00 mg/dL   Calcium 8.1 (L) 8.9 - 10.3 mg/dL   Total Protein 6.7 6.5 - 8.1 g/dL   Albumin 2.6 (L) 3.5 - 5.0 g/dL   AST 18 15 - 41 U/L   ALT 22 14 - 54 U/L   Alkaline Phosphatase 89 38 - 126 U/L   Total Bilirubin 0.5 0.3 - 1.2 mg/dL   GFR calc non Af Amer >60 >60 mL/min   GFR calc Af Amer >60 >60 mL/min   Anion gap 5 5 - 15   Abdominal Xray done this am-report pending  A/P: SBO-Crohn's vs adhesions              Feeling better this am              No significant ostomy output yet         Continue NG, IV fluids         D/W patient and husband

## 2016-03-26 ENCOUNTER — Inpatient Hospital Stay (HOSPITAL_COMMUNITY): Payer: Self-pay

## 2016-03-26 NOTE — Progress Notes (Addendum)
Assumed care of pt after receiving report from previous shift nurse. Pt resting without complaints. Informed pt we will walk around unit in a few minutes. Stated will do so with husband.   2030: NGT clamped so pt can ambulate. Pt up walking and tolerating it well. NGT placement confirmed in abdomen.  2320: Pt up walking with husband again. NGT clamped and reconnected after pt back from walk.    2340: MD called to unit enquiring of pt's output. Report status of pt given. Pt states had voided twice during day shift but urine not measured. Voided in the toilet during current shift. Instructed pt to void in hat provided. Pt verbalizes understanding.   0003: Benadryl 12.5 mg and 30 mg Toradol IVPush given upon pt's request.   0110: pt resting quietly with eyes closed.   0250: infant fed and diaper changed per parents request to sleep longer.

## 2016-03-26 NOTE — Progress Notes (Signed)
Patient taken to xray via wheelchair per order.

## 2016-03-26 NOTE — Progress Notes (Signed)
4 Days Post-Op  Subjective: No complaints. Has felt rumbling in abd and had a small amount of air in bag  Objective: Vital signs in last 24 hours: Temp:  [97.9 F (36.6 C)-98.6 F (37 C)] 97.9 F (36.6 C) (12/23 2203) Pulse Rate:  [67-83] 67 (12/23 2203) Resp:  [18] 18 (12/23 2203) BP: (105-112)/(55-72) 112/65 (12/23 2203) SpO2:  [97 %-98 %] 97 % (12/23 1800)    Intake/Output from previous day: 12/23 0701 - 12/24 0700 In: 3208.3 [P.O.:300; I.V.:2908.3] Out: 1350 [Emesis/NG output:1350] Intake/Output this shift: Total I/O In: 1125 [I.V.:1125] Out: 550 [Emesis/NG output:550]  Resp: clear to auscultation bilaterally Cardio: regular rate and rhythm GI: soft, minimal tenderness. ostomy with minimal output  Lab Results:   Recent Labs  03/24/16 0844 03/25/16 0512  WBC 20.4* 14.7*  HGB 11.8* 11.4*  HCT 35.2* 35.1*  PLT 355 384   BMET  Recent Labs  03/24/16 0844 03/25/16 0512  NA 135 139  K 3.6 3.6  CL 102 103  CO2 24 31  GLUCOSE 118* 101*  BUN 9 17  CREATININE 0.39* 0.52  CALCIUM 8.5* 8.1*   PT/INR No results for input(s): LABPROT, INR in the last 72 hours. ABG No results for input(s): PHART, HCO3 in the last 72 hours.  Invalid input(s): PCO2, PO2  Studies/Results: Ct Abdomen Pelvis W Contrast  Result Date: 03/24/2016 CLINICAL DATA:  35 year old female with nausea and vomiting. History of Crohn's disease status post hernia repair. Evaluate for small bowel obstruction. History of C-section on 03/22/2016. EXAM: CT ABDOMEN AND PELVIS WITH CONTRAST TECHNIQUE: Multidetector CT imaging of the abdomen and pelvis was performed using the standard protocol following bolus administration of intravenous contrast. CONTRAST:  ISOVUE-300 IOPAMIDOL (ISOVUE-300) INJECTION 61% COMPARISON:  CT the abdomen and pelvis 02/12/2012. FINDINGS: Lower chest: Unremarkable. Hepatobiliary: No cystic or solid hepatic lesions. No intra or extrahepatic biliary ductal dilatation.  Several calcified gallstones are noted lying dependently in the gallbladder. Gallbladder does not appear distended, and the gallbladder wall does not appear thickened to suggest acute cholecystitis. Pancreas: No pancreatic mass. No pancreatic ductal dilatation. No pancreatic or peripancreatic fluid or inflammatory changes. Spleen: Unremarkable. Adrenals/Urinary Tract: Probable 1-2 mm nonobstructive calculus in the interpolar collecting system of the right kidney. Kidneys and bilateral adrenal glands are otherwise normal in appearance. No hydroureteronephrosis. Urinary bladder is normal in appearance. Stomach/Bowel: The stomach is partially decompressed, and otherwise unremarkable in appearance. The proximal small bowel, including the proximal aspect of the jejunum appear completely decompressed. There are multiple dilated loops of small bowel throughout the abdomen measuring up to 5.1 cm in diameter, with multiple air-fluid levels. Several of these bowel loops appear slightly thickened despite being dilated, with associated hypervascularity in their mesentery, indicative of areas of active inflammation. Multiple other areas of significant bowel wall thickening are noted, best appreciated on coronal images 35 of series 602 in the right-sided the abdomen, and image 40 of series 602 in the central upper abdomen. 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 The bowel leading up to the ileostomy site is poorly depicted on today's examination. Vascular/Lymphatic: No significant atherosclerotic disease or definite aneurysm identified in the abdominal or pelvic vasculature. No lymphadenopathy is noted in the abdomen or pelvis. Reproductive: Post gravid uterus remains enlarged and extends slightly out of the anatomic pelvis into the lower right side of the abdomen. Ovaries are unremarkable in appearance. Other: Small volume of ascites. Small amount of pneumoperitoneum. Gas  in the anterior abdominal wall musculature and the overlying subcutaneous fat, centered around the level of the C-section incision, and in close proximity to the ileostomy. Musculoskeletal: There are no aggressive appearing lytic or blastic lesions noted in the visualized portions of the skeleton. IMPRESSION: 1. Small amount of pneumoperitoneum and ascites, as well as intramuscular and subcutaneous gas in the anterior abdominal wall, strongly favored to all be related to recent C-section. 2. There is dilatation of the small bowel which measures up to 5.1 cm in diameter, with multiple air-fluid levels. While the possibility of bowel obstruction is not excluded, it is not strongly favored, particularly in light of the decompressed stomach and proximal jejunum/duodenum. Rather, findings are favored to be related to active Crohn's disease given multiple segmental areas of mild narrowing, bowel wall thickening and hypervascularity in the mesenteric. 3. Cholelithiasis without evidence to suggest an acute cholecystitis at this time. 4. Probable 1-2 mm nonobstructive calculus in the interpolar collecting system of the right kidney. These results were called by telephone at the time of interpretation on 03/24/2016 at 5:04 pm to Dr. Harold HedgeJAMES TOMBLIN, who verbally acknowledged these results. Electronically Signed   By: Trudie Reedaniel  Entrikin M.D.   On: 03/24/2016 17:04   Dg Abd Acute W/chest  Result Date: 03/24/2016 CLINICAL DATA:  Postoperative nausea and vomiting. C-section 03/22/2016. History of Crohn's disease with colectomy and ileostomy. EXAM: DG ABDOMEN ACUTE W/ 1V CHEST COMPARISON:  CT abdomen pelvis 02/12/2012 FINDINGS: The lungs are clear. Negative for infiltrate or effusion. Heart size upper normal Small amount of pneumoperitoneum under the right hemidiaphragm. This may be related to the patient's recent C-section 2 days ago. The small bowel is diffusely dilated with numerous air-fluid levels. Colon surgically absent.  Small bowel does not appear to be thickened. No acute skeletal abnormality. IMPRESSION: No active cardiopulmonary abnormality Small-bowel obstruction pattern. Small amount of pneumoperitoneum consistent with recent C-section. Surgical consultation recommended. CT abdomen pelvis may be helpful for further evaluation. These results were called by telephone at the time of interpretation on 03/24/2016 at 3:08 pm to Dr. Harold HedgeJAMES TOMBLIN , who verbally acknowledged these results. Electronically Signed   By: Marlan Palauharles  Clark M.D.   On: 03/24/2016 15:08   Dg Abd Portable 1v  Result Date: 03/25/2016 CLINICAL DATA:  Abdominal distention, pain EXAM: PORTABLE ABDOMEN - 1 VIEW COMPARISON:  03/24/2016 FINDINGS: Enteric tube terminates in the mid gastric body. Mildly dilated loops of small bowel in the left mid abdomen, possibly reflecting adynamic ileus, small bowel obstruction not excluded. Left lower quadrant ostomy. IMPRESSION: Enteric tube terminates in the mid gastric body. Mildly dilated loops of small bowel in the left mid abdomen, possibly reflecting adynamic ileus, small bowel obstruction not excluded. This is better evaluated on recent CT, when small bowel obstruction was not favored. Electronically Signed   By: Charline BillsSriyesh  Krishnan M.D.   On: 03/25/2016 09:49   Dg Abd Portable 1v  Result Date: 03/24/2016 CLINICAL DATA:  NG tube placement EXAM: PORTABLE ABDOMEN - 1 VIEW COMPARISON:  03/24/2016 FINDINGS: Minimal atelectasis at the left base. Esophageal tube is looped upon itself and the tip overlies the left upper abdomen, presumably mid stomach. Residual contrast material is present within the renal collecting systems with mild hydronephrosis on the right. Multiple loops of dilated small bowel in the central abdomen. IMPRESSION: Esophageal tube tip is looped upon itself in the left upper quadrant, the tip superimposes the mid stomach Residual contrast material within the collecting system of the kidneys with  suggestion of  mild right hydronephrosis Multiple loops of dilated small bowel suggestive of obstruction Electronically Signed   By: Jasmine Pang M.D.   On: 03/24/2016 19:08    Anti-infectives: Anti-infectives    Start     Dose/Rate Route Frequency Ordered Stop   03/22/16 0600  gentamicin (GARAMYCIN) 300 mg, clindamycin (CLEOCIN) 900 mg in dextrose 5 % 100 mL IVPB     227 mL/hr over 30 Minutes Intravenous On call to O.R. 03/21/16 1214 03/22/16 1517      Assessment/Plan: s/p Procedure(s) with comments: CESAREAN SECTION (N/A) - Repeat edc 12/26/174 allergic to peanuts, PCN Continue ng and bowel rest  Repeat abd xrays today Appreciate GI input  LOS: 4 days    TOTH III,Daylen Hack S 03/26/2016

## 2016-03-26 NOTE — Progress Notes (Signed)
POD #4 Small amount of gas in ostomy bag Ambulating well  Vitals:   03/25/16 1800 03/25/16 2203  BP: 108/64 112/65  Pulse: 71 67  Resp: 18 18  Temp: 98.6 F (37 C) 97.9 F (36.6 C)   Abdomen soft, BS+   A/P: POD #4 C/S         SBO         Continue IV fluids, NG suction         Abdominal Xray today         Labs in am         Appreciate GI and Surgery assistance

## 2016-03-26 NOTE — Lactation Note (Signed)
This note was copied from a baby's chart. Lactation Consultation Note  Mom continues to recover and is not able to BF at this time. She is trying to pump every time the baby eats.  Baby's volume is currently at 60-70 ml and mom is expressing about 15-30 ml.  Encouraged using the initiation phase until she expresses 20 ml 3 times in a row.  Follow-up tomorrow.  Patient Name: Sandra Love [id number] Date: 03/26/2016 Reason for consult: Follow-up assessment   Maternal Data    Feeding Feeding Type: Breast Milk with Formula added  LATCH Score/Interventions                      Lactation Tools Discussed/Used     Consult Status Consult Status: Follow-up Date: 03/27/16 Follow-up type: In-patient    Soyla Dryer 03/26/2016, 7:11 PM

## 2016-03-26 NOTE — Progress Notes (Signed)
[facility] Progress Note  Subjective: Abdomen still distended. Passed a little gas into ileostomy bag. NG tube annoying.  Objective: Vital signs in last 24 hours: Temp:  [97.9 F (36.6 C)-98.6 F (37 C)] 98.2 F (36.8 C) (12/24 0800) Pulse Rate:  [67-83] 72 (12/24 0800) Resp:  [18-20] 20 (12/24 0800) BP: (108-112)/(58-72) 109/58 (12/24 0800) SpO2:  [97 %-98 %] 98 % (12/24 0800) Weight change:    PE:  No distress Abdomen with some bowel sounds, high pitch, mild tenderness. X ray of abdomen consistent with SBO   Lab Results: No results found for this or any previous visit (from the past 24 hour(s)).  Studies/Results: Dg Abd Acute W/chest  Result Date: 03/26/2016 CLINICAL DATA:  Small obstruction, followup EXAM: DG ABDOMEN ACUTE W/ 1V CHEST COMPARISON:  03/25/2016 abdominal radiograph, chest radiograph 03/24/2016 FINDINGS: Nasogastric tube extends into stomach. Normal heart size, mediastinal contours, and pulmonary vascularity. Lungs clear. No pleural effusion or pneumothorax. Dilated small bowel loops with multiple air-fluid levels on upright view. Question mild small bowel wall thickening. Small amount of free intraperitoneal air seen under the RIGHT diaphragm on prior chest radiograph no longer identified. Nasogastric tube coiled in stomach. Prior subtotal colectomy by history. Ileostomy LEFT lower quadrant. Multiple calcified gallstones. Bones unremarkable. No ureter calcification. IMPRESSION: Persistent small bowel dilatation consistent with obstruction. Cholelithiasis. No definite free air visualized. Electronically Signed   By: Ulyses SouthwardMark  Boles M.D.   On: 03/26/2016 11:11      Assessment: SBO  Plan:   Continue NG suction another day. Repeat abdomen xray tomorrow. ? Trial of steroids tomorrow if no better (in case this is Crohn's) discussed with patient.    Gwenevere AbbotSAM F Jett Kulzer 03/26/2016, 12:04 PM  Pager: [phone] If no answer or after 5 PM call [phone]

## 2016-03-27 ENCOUNTER — Inpatient Hospital Stay (HOSPITAL_COMMUNITY): Payer: Self-pay

## 2016-03-27 LAB — COMPREHENSIVE METABOLIC PANEL
ALT: 22 U/L (ref 14–54)
AST: 19 U/L (ref 15–41)
Albumin: 2.6 g/dL — ABNORMAL LOW (ref 3.5–5.0)
Alkaline Phosphatase: 69 U/L (ref 38–126)
Anion gap: 11 (ref 5–15)
BILIRUBIN TOTAL: 0.9 mg/dL (ref 0.3–1.2)
BUN: 39 mg/dL — AB (ref 6–20)
CO2: 27 mmol/L (ref 22–32)
Calcium: 8.1 mg/dL — ABNORMAL LOW (ref 8.9–10.3)
Chloride: 103 mmol/L (ref 101–111)
Creatinine, Ser: 0.53 mg/dL (ref 0.44–1.00)
GFR calc Af Amer: >60 mL/min (ref >60)
Glucose, Bld: 72 mg/dL (ref 65–99)
POTASSIUM: 3 mmol/L — AB (ref 3.5–5.1)
Sodium: 141 mmol/L (ref 135–145)
TOTAL PROTEIN: 6.5 g/dL (ref 6.5–8.1)

## 2016-03-27 LAB — CBC
HEMATOCRIT: 31.9 % — AB (ref 36.0–46.0)
Hemoglobin: 10.1 g/dL — ABNORMAL LOW (ref 12.0–15.0)
MCH: 26.1 pg (ref 26.0–34.0)
MCHC: 31.7 g/dL (ref 30.0–36.0)
MCV: 82.4 fL (ref 78.0–100.0)
Platelets: 358 10*3/uL (ref 150–400)
RBC: 3.87 MIL/uL (ref 3.87–5.11)
RDW: 14.5 % (ref 11.5–15.5)
WBC: 8.9 10*3/uL (ref 4.0–10.5)

## 2016-03-27 MED ORDER — POTASSIUM CHLORIDE 10 MEQ/100ML IV SOLN
10.0000 meq | INTRAVENOUS | Status: AC
  Administered 2016-03-27: 10 meq via INTRAVENOUS
  Filled 2016-03-27 (×3): qty 100

## 2016-03-27 MED ORDER — POTASSIUM CHLORIDE 2 MEQ/ML IV SOLN
30.0000 meq | Freq: Once | INTRAVENOUS | Status: DC
  Filled 2016-03-27: qty 15

## 2016-03-27 NOTE — Progress Notes (Signed)
[facility] Progress Note  Subjective: The patient feels about the same today as yesterday. She denies abdominal pain. Her abdomen is still distended. She is passing a tiny bit of gas into her ileostomy.  Review of her abdominal x-ray today shows that there is still evidence of small bowel obstruction.  NG tube is still in place.    Objective: Vital signs in last 24 hours: Temp:  [98.2 F (36.8 C)-98.3 F (36.8 C)] 98.2 F (36.8 C) (12/25 0500) Pulse Rate:  [57-68] 57 (12/25 0901) Resp:  [18-20] 18 (12/25 0901) BP: (100-112)/(54-68) 106/56 (12/25 0901) SpO2:  [97 %-98 %] 98 % (12/25 0020) Weight change:    PE:  No distress  Abdomen: Still distended, not tender, high-pitched bowel sounds heard  Lab Results: Results for orders placed or performed during the hospital encounter of 03/22/16 (from the past 24 hour(s))  CBC     Status: Abnormal   Collection Time: 03/27/16  5:17 AM  Result Value Ref Range   WBC 8.9 4.0 - 10.5 K/uL   RBC 3.87 3.87 - 5.11 MIL/uL   Hemoglobin 10.1 (L) 12.0 - 15.0 g/dL   HCT 16.1 (L) 09.6 - 04.5 %   MCV 82.4 78.0 - 100.0 fL   MCH 26.1 26.0 - 34.0 pg   MCHC 31.7 30.0 - 36.0 g/dL   RDW 40.9 81.1 - 91.4 %   Platelets 358 150 - 400 K/uL  Comprehensive metabolic panel     Status: Abnormal   Collection Time: 03/27/16  5:17 AM  Result Value Ref Range   Sodium 141 135 - 145 mmol/L   Potassium 3.0 (L) 3.5 - 5.1 mmol/L   Chloride 103 101 - 111 mmol/L   CO2 27 22 - 32 mmol/L   Glucose, Bld 72 65 - 99 mg/dL   BUN 39 (H) 6 - 20 mg/dL   Creatinine, Ser 7.82 0.44 - 1.00 mg/dL   Calcium 8.1 (L) 8.9 - 10.3 mg/dL   Total Protein 6.5 6.5 - 8.1 g/dL   Albumin 2.6 (L) 3.5 - 5.0 g/dL   AST 19 15 - 41 U/L   ALT 22 14 - 54 U/L   Alkaline Phosphatase 69 38 - 126 U/L   Total Bilirubin 0.9 0.3 - 1.2 mg/dL   GFR calc non Af Amer >60 >60 mL/min   GFR calc Af Amer >60 >60 mL/min   Anion gap 11 5 - 15    Studies/Results: Dg Abd 2 Views  Result  Date: 03/27/2016 CLINICAL DATA:  Post C-section small bowel obstruction. EXAM: ABDOMEN - 2 VIEW COMPARISON:  03/26/2016 FINDINGS: Nasogastric tube coiled once over the stomach with tip in the midline likely over the distal stomach unchanged. Examination demonstrates persistent air-filled dilated small bowel loops in the central abdomen measuring up to 4.5 cm with slight overall less dilatation. Persistent air-fluid levels over the dilated small bowel. No free peritoneal air. Remainder the exam is unchanged. IMPRESSION: Findings compatible patient's known small bowel obstruction versus ileus with slight improvement. Nasogastric tube unchanged. Electronically Signed   By: Elberta Fortis M.D.   On: 03/27/2016 09:07      Assessment: Small bowel obstruction  Plan:   She is still showing evidence of small bowel obstruction despite NG tube suction. 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 She had lysis of adhesions a few years ago. I don't think this is secondary to Crohn's disease  especially since she did not have any symptoms to suggest that her Crohn's disease was giving her any problems. I offered to start IV steroids today just in case this might be a small bowel obstruction related to Crohn's disease, but the patient does not wish to start steroids at this time because she is breast-feeding and would like to continue to do that. She would like to give the NG tube suctioning another day and see what happens. If the small bowel obstruction does not resolve with continued NG suction, I think are only other options are to try steroids and see if it helps just in case there is a component of Crohn's disease causing this, and if this doesn't help call surgery back for persistent small bowel obstruction secondary to adhesions.    Gwenevere AbbotSAM F Shai Rasmussen 03/27/2016, 12:35 PM  Pager: [phone] If no answer or after 5 PM call  [phone]

## 2016-03-27 NOTE — Progress Notes (Signed)
UR chart review completed.  

## 2016-03-27 NOTE — Progress Notes (Signed)
S: Pt feeling improved - feeling hungry. However, has passed only minimal gas in ostomy bag. Feels like her abdomen in getting even softer. Minimal pain. Ambulating.   O:  Vitals:   03/27/16 0500 03/27/16 0901  BP: 110/68 (!) 106/56  Pulse: (!) 57 (!) 57  Resp: 18 18  Temp: 98.2 F (36.8 C)    Gen: A&A, NAD, NGT in place with green output Abd: soft, mod distention (improved from yesterday), +hypoactive bs, nontender, ff@Umb -2cm, Incision c/d/i GYN: No blood on pad  A/P: POD#5 s/p CS c/b pp SBO.  Pt is overall improving clinically - she is feeling well and hungry and her abdominal exam is significantly improving. However, AAA with some but minimal improvement, NGT still w/good amnt of output and passing minimal gas. Thus, continue NGT for now. Can consider NGT clamp trial when passing more gas and NGT output downtrends more. Appreciate Gi/gen surg recs. Cont IVF w/NGT. Labs in Am. Replete K today.   Elon Spanner MD

## 2016-03-27 NOTE — Lactation Note (Signed)
This note was copied from a baby's chart. Lactation Consultation Note; Mother has been regularly pumping her breast getting 15-30 ml  each time.  Mother is getting ready to pump now. She denies having any concerns about breastfeeding. She is feeling some better. Mother is aware of LC services and will call if needed.  Patient Name: Sandra Love [id number] Date: 03/27/2016 Reason for consult: Follow-up assessment   Maternal Data    Feeding    LATCH Score/Interventions                      Lactation Tools Discussed/Used     Consult Status Consult Status: Follow-up Date: 03/27/16 Follow-up type: In-patient    Stevan Born [facility] 03/27/2016, 10:45 AM

## 2016-03-28 ENCOUNTER — Inpatient Hospital Stay (HOSPITAL_COMMUNITY): Payer: Self-pay

## 2016-03-28 LAB — CBC
HEMATOCRIT: 29.6 % — AB (ref 36.0–46.0)
HEMOGLOBIN: 9.5 g/dL — AB (ref 12.0–15.0)
MCH: 26.3 pg (ref 26.0–34.0)
MCHC: 32.1 g/dL (ref 30.0–36.0)
MCV: 82 fL (ref 78.0–100.0)
Platelets: 377 10*3/uL (ref 150–400)
RBC: 3.61 MIL/uL — ABNORMAL LOW (ref 3.87–5.11)
RDW: 14.4 % (ref 11.5–15.5)
WBC: 9.5 10*3/uL (ref 4.0–10.5)

## 2016-03-28 LAB — COMPREHENSIVE METABOLIC PANEL
ALK PHOS: 65 U/L (ref 38–126)
ALT: 17 U/L (ref 14–54)
ANION GAP: 11 (ref 5–15)
AST: 20 U/L (ref 15–41)
Albumin: 2.5 g/dL — ABNORMAL LOW (ref 3.5–5.0)
BILIRUBIN TOTAL: 0.4 mg/dL (ref 0.3–1.2)
BUN: 39 mg/dL — ABNORMAL HIGH (ref 6–20)
CALCIUM: 7.8 mg/dL — AB (ref 8.9–10.3)
CO2: 23 mmol/L (ref 22–32)
CREATININE: 0.53 mg/dL (ref 0.44–1.00)
Chloride: 104 mmol/L (ref 101–111)
GFR calc non Af Amer: >60 mL/min (ref >60)
GLUCOSE: 69 mg/dL (ref 65–99)
Potassium: 3.2 mmol/L — ABNORMAL LOW (ref 3.5–5.1)
Sodium: 138 mmol/L (ref 135–145)
TOTAL PROTEIN: 6 g/dL — AB (ref 6.5–8.1)

## 2016-03-28 MED ORDER — KCL-LACTATED RINGERS-D5W 20 MEQ/L IV SOLN
INTRAVENOUS | Status: DC
  Administered 2016-03-28: 14:00:00 via INTRAVENOUS
  Filled 2016-03-28 (×2): qty 1000

## 2016-03-28 MED ORDER — METHYLPREDNISOLONE SODIUM SUCC 40 MG IJ SOLR
40.0000 mg | Freq: Three times a day (TID) | INTRAMUSCULAR | Status: DC
  Administered 2016-03-28 – 2016-03-31 (×6): 40 mg via INTRAVENOUS
  Filled 2016-03-28 (×15): qty 1

## 2016-03-28 MED ORDER — POTASSIUM PHOSPHATES 15 MMOLE/5ML IV SOLN
20.0000 meq | Freq: Three times a day (TID) | INTRAVENOUS | Status: DC
  Filled 2016-03-28: qty 4.55

## 2016-03-28 MED ORDER — POTASSIUM CHLORIDE 2 MEQ/ML IV SOLN
INTRAVENOUS | Status: DC
  Administered 2016-03-28 – 2016-03-30 (×3): via INTRAVENOUS
  Filled 2016-03-28 (×6): qty 1000

## 2016-03-28 NOTE — Progress Notes (Signed)
States is not passing gas, however noted BS on Left side of abdomen. Diminished on Right side.

## 2016-03-28 NOTE — Progress Notes (Signed)
[facility] Progress Note  Subjective: The patient is feeling about the same some increased gurgling in her stomach, abdominal contour subjectively unchanged to slightly improved. NG output only about 100 mL the last 12 hours which is decreased.  Objective: Vital signs in last 24 hours: Temp:  [98 F (36.7 C)-98.1 F (36.7 C)] 98.1 F (36.7 C) (12/26 0023) Pulse Rate:  [49-67] 57 (12/26 0023) Resp:  [16-18] 16 (12/25 2113) BP: (112-122)/(59-63) 112/61 (12/26 0023) Weight change:    PE: Abdomen distended somewhat taut, some intestinal loops appear to be palpable.  Lab Results: Results for orders placed or performed during the hospital encounter of 03/22/16 (from the past 24 hour(s))  CBC     Status: Abnormal   Collection Time: 03/28/16  5:25 AM  Result Value Ref Range   WBC 9.5 4.0 - 10.5 K/uL   RBC 3.61 (L) 3.87 - 5.11 MIL/uL   Hemoglobin 9.5 (L) 12.0 - 15.0 g/dL   HCT 16.129.6 (L) 09.636.0 - 04.546.0 %   MCV 82.0 78.0 - 100.0 fL   MCH 26.3 26.0 - 34.0 pg   MCHC 32.1 30.0 - 36.0 g/dL   RDW 40.914.4 81.111.5 - 91.415.5 %   Platelets 377 150 - 400 K/uL  Comprehensive metabolic panel     Status: Abnormal   Collection Time: 03/28/16  5:25 AM  Result Value Ref Range   Sodium 138 135 - 145 mmol/L   Potassium 3.2 (L) 3.5 - 5.1 mmol/L   Chloride 104 101 - 111 mmol/L   CO2 23 22 - 32 mmol/L   Glucose, Bld 69 65 - 99 mg/dL   BUN 39 (H) 6 - 20 mg/dL   Creatinine, Ser 7.820.53 0.44 - 1.00 mg/dL   Calcium 7.8 (L) 8.9 - 10.3 mg/dL   Total Protein 6.0 (L) 6.5 - 8.1 g/dL   Albumin 2.5 (L) 3.5 - 5.0 g/dL   AST 20 15 - 41 U/L   ALT 17 14 - 54 U/L   Alkaline Phosphatase 65 38 - 126 U/L   Total Bilirubin 0.4 0.3 - 1.2 mg/dL   GFR calc non Af Amer >60 >60 mL/min   GFR calc Af Amer >60 >60 mL/min   Anion gap 11 5 - 15    Studies/Results: Dg Abd 1 View  Result Date: 03/28/2016 CLINICAL DATA:  Small bowel obstruction, prior C-section, history Crohn's disease EXAM: ABDOMEN - 1 VIEW COMPARISON:   03/27/2016 FINDINGS: Tip of nasogastric tube projects over descending duodenum. Dilated small bowel loops in the upper mid abdomen with paucity of colonic gas unchanged. No definite free intraperitoneal air. Lung bases clear. Calcified gallstones in RIGHT upper quadrant. IMPRESSION: Persistent dilated small bowel loops with paucity of colonic gas, question small bowel obstruction versus ileus. Electronically Signed   By: Ulyses SouthwardMark  Boles M.D.   On: 03/28/2016 08:10   Dg Abd 2 Views  Result Date: 03/27/2016 CLINICAL DATA:  Post C-section small bowel obstruction. EXAM: ABDOMEN - 2 VIEW COMPARISON:  03/26/2016 FINDINGS: Nasogastric tube coiled once over the stomach with tip in the midline likely over the distal stomach unchanged. Examination demonstrates persistent air-filled dilated small bowel loops in the central abdomen measuring up to 4.5 cm with slight overall less dilatation. Persistent air-fluid levels over the dilated small bowel. No free peritoneal air. Remainder the exam is unchanged. IMPRESSION: Findings compatible patient's known small bowel obstruction versus ileus with slight improvement. Nasogastric tube unchanged. Electronically Signed   By: Elberta Fortisaniel  Boyle M.D.   On: 03/27/2016 09:07  Dg Abd Acute W/chest  Result Date: 03/26/2016 CLINICAL DATA:  Small obstruction, followup EXAM: DG ABDOMEN ACUTE W/ 1V CHEST COMPARISON:  03/25/2016 abdominal radiograph, chest radiograph 03/24/2016 FINDINGS: Nasogastric tube extends into stomach. Normal heart size, mediastinal contours, and pulmonary vascularity. Lungs clear. No pleural effusion or pneumothorax. Dilated small bowel loops with multiple air-fluid levels on upright view. Question mild small bowel wall thickening. Small amount of free intraperitoneal air seen under the RIGHT diaphragm on prior chest radiograph no longer identified. Nasogastric tube coiled in stomach. Prior subtotal colectomy by history. Ileostomy LEFT lower quadrant. Multiple calcified  gallstones. Bones unremarkable. No ureter calcification. IMPRESSION: Persistent small bowel dilatation consistent with obstruction. Cholelithiasis. No definite free air visualized. Electronically Signed   By: Ulyses Southward M.D.   On: 03/26/2016 11:11      Assessment: Small bowel obstruction more likely than ileus, unclear whether primarily due to adhesions or active inflammation from Crohn's. Overall little to no improvement radiologically and clinically over 4 days.  Plan: Encouraged to trial of corticosteroids for 48 hours or so but patient still reluctant due to breast-feeding. Will await surgeons input. If agreeable, suggest Solu-Medrol 40 mg IV every 8 hours. Will follow-up tomorrow.    Tarick Parenteau C 03/28/2016, 9:09 AM  Pager [phone] If no answer or after 5 PM call [phone]

## 2016-03-28 NOTE — Progress Notes (Signed)
Baby discharged home with dad.  Mom resting this afternoon after IV Toradol and Benadryl given.  Abdomen soft Mom encouraged to ambulate this afternoon

## 2016-03-28 NOTE — Progress Notes (Signed)
To xray via wheelchair.

## 2016-03-28 NOTE — Lactation Note (Signed)
This note was copied from a baby's chart. Lactation Consultation Note  Baby 706 days old.  Mother still hospitalized due to ileus/blockage. Mother states she has been exhausted on has not pumped since early on 12/25.  Baby has been formula fed. FOB asked how often she needed to pump to protect her milk supply. Explained ideally q 3 hr but understand mother is exhausted. Discussed supply and demand.  Suggest alternating hand expression with using DEBP. Provided mother with colostrum containers and helped assemble DEBP parts. Mother plans to pump shortly. Suggest she call LC if she needs further assistance w/ pumping today.  Patient Name: Sandra Love [id number]'s Date: 03/28/2016 Reason for consult: Follow-up assessment   Maternal Data    Feeding    LATCH Score/Interventions                      Lactation Tools Discussed/Used     Consult Status Consult Status: PRN    Sandra Love, Sandra Love 03/28/2016, 8:49 AM

## 2016-03-28 NOTE — Progress Notes (Signed)
[facility]:  [phone] General Surgery Progress Note   LOS: 6 days  POD -  6 Days Post-Op  Assessment/Plan: 1. SBO  WBC - 8,900 - 03/28/2016  KUB pattern not much changed though she feels better  Plan:  1)  Discuss with Dr. Madilyn Fireman and Vickey Sages, 2) Steroids would be okay - though it is hard to tell how much of this is Crohn's and how much from scar tissue/recent C Section, 3) Consider PICC and TPN to start tomorrow (tomorrow is 7 days after the C section), 4) if no better by tomorrow, consider transfer to [facility], in that her decision making is primarily regarding her SBO and not the C section  I spent 30+ minutes with the patient and husband going over a plan and options.  I showed the husband her x-ray films on Epic.  2.  Crohn's disease   h/o total colectomy and end ileostomy (initially RLQ and later moved to LLQ).  She's had subtotal colectomy at [facility] for "Crohn's disease", followed by Dr. Ewing Schlein - but does not see him regularly and is on no Crohn's meds  3.  Incarcerated right lower quadrant hernia repaired by Dr. Michaell Cowing 02/13/2012 - he used 15 x 20 cm physiomesh (primarily preperitoneal)  3.  CESAREAN SECTION - 03/22/2016 - M. Morris  She has a boy 4.   DVT prophylaxis - no chemoprophylaxis at this time   Active Problems:   S/P cesarean section   Subjective:  Feels better, but nothing out of ileostomy.  The NGT bothers her, but not too bad.  Her husband is at the bedside.  Objective:   Vitals:   03/27/16 2113 03/28/16 0023  BP: 122/63 112/61  Pulse: 67 (!) 57  Resp: 16   Temp: 98 F (36.7 C) 98.1 F (36.7 C)     Intake/Output from previous day:  12/25 0701 - 12/26 0700 In: 480 [P.O.:480] Out: 700 [Urine:700]  Intake/Output this shift:  No intake/output data recorded.   Physical Exam:   General: WN WF who is alert and oriented.    HEENT: Normal. Pupils equal. .   Lungs: Clear   Abdomen: Distended, but soft and has BS.  Ostomy LLQ.   Wound:  Clean   Lab Results:    Recent Labs  03/27/16 0517 03/28/16 0525  WBC 8.9 9.5  HGB 10.1* 9.5*  HCT 31.9* 29.6*  PLT 358 377    BMET   Recent Labs  03/27/16 0517 03/28/16 0525  NA 141 138  K 3.0* 3.2*  CL 103 104  CO2 27 23  GLUCOSE 72 69  BUN 39* 39*  CREATININE 0.53 0.53  CALCIUM 8.1* 7.8*    PT/INR  No results for input(s): LABPROT, INR in the last 72 hours.  ABG  No results for input(s): PHART, HCO3 in the last 72 hours.  Invalid input(s): PCO2, PO2   Studies/Results:  Dg Abd 1 View  Result Date: 03/28/2016 CLINICAL DATA:  Small bowel obstruction, prior C-section, history Crohn's disease EXAM: ABDOMEN - 1 VIEW COMPARISON:  03/27/2016 FINDINGS: Tip of nasogastric tube projects over descending duodenum. Dilated small bowel loops in the upper mid abdomen with paucity of colonic gas unchanged. No definite free intraperitoneal air. Lung bases clear. Calcified gallstones in RIGHT upper quadrant. IMPRESSION: Persistent dilated small bowel loops with paucity of colonic gas, question small bowel obstruction versus ileus. Electronically Signed   By: Ulyses Southward M.D.   On: 03/28/2016 08:10   Dg Abd 2 Views  Result Date: 03/27/2016 CLINICAL DATA:  Post C-section small bowel obstruction. EXAM: ABDOMEN - 2 VIEW COMPARISON:  03/26/2016 FINDINGS: Nasogastric tube coiled once over the stomach with tip in the midline likely over the distal stomach unchanged. Examination demonstrates persistent air-filled dilated small bowel loops in the central abdomen measuring up to 4.5 cm with slight overall less dilatation. Persistent air-fluid levels over the dilated small bowel. No free peritoneal air. Remainder the exam is unchanged. IMPRESSION: Findings compatible patient's known small bowel obstruction versus ileus with slight improvement. Nasogastric tube unchanged. Electronically Signed   By: Elberta Fortisaniel  Boyle M.D.   On: 03/27/2016 09:07   Dg Abd Acute W/chest  Result Date:  03/26/2016 CLINICAL DATA:  Small obstruction, followup EXAM: DG ABDOMEN ACUTE W/ 1V CHEST COMPARISON:  03/25/2016 abdominal radiograph, chest radiograph 03/24/2016 FINDINGS: Nasogastric tube extends into stomach. Normal heart size, mediastinal contours, and pulmonary vascularity. Lungs clear. No pleural effusion or pneumothorax. Dilated small bowel loops with multiple air-fluid levels on upright view. Question mild small bowel wall thickening. Small amount of free intraperitoneal air seen under the RIGHT diaphragm on prior chest radiograph no longer identified. Nasogastric tube coiled in stomach. Prior subtotal colectomy by history. Ileostomy LEFT lower quadrant. Multiple calcified gallstones. Bones unremarkable. No ureter calcification. IMPRESSION: Persistent small bowel dilatation consistent with obstruction. Cholelithiasis. No definite free air visualized. Electronically Signed   By: Ulyses SouthwardMark  Boles M.D.   On: 03/26/2016 11:11     Anti-infectives:   Anti-infectives    Start     Dose/Rate Route Frequency Ordered Stop   03/22/16 0600  gentamicin (GARAMYCIN) 300 mg, clindamycin (CLEOCIN) 900 mg in dextrose 5 % 100 mL IVPB     227 mL/hr over 30 Minutes Intravenous On call to O.R. 03/21/16 1214 03/22/16 1517      Ovidio Kinavid Jaquavian Firkus, MD, FACS Pager: [phone] [facility]: [phone] 03/28/2016

## 2016-03-28 NOTE — Progress Notes (Signed)
Subjective: Postpartum Day 6: Cesarean Delivery Patient reports feeling hungery but otherwise unchanged.  Minimal flatus.  Denies pain.  Ambulating Abdominal Xray this am  Objective: Vital signs in last 24 hours: Temp:  [98 F (36.7 C)-98.1 F (36.7 C)] 98.1 F (36.7 C) (12/26 0023) Pulse Rate:  [49-67] 57 (12/26 0023) Resp:  [16-18] 16 (12/25 2113) BP: (106-122)/(56-63) 112/61 (12/26 0023)  Physical Exam: NG output 600-900cc/day Gen - NAD Abd - softly distended.  Minimal BS.  NT.  Inc c/d/i Ext - NT   Recent Labs  03/27/16 0517 03/28/16 0525  HGB 10.1* 9.5*  HCT 31.9* 29.6*  Potassium 3.2  Abdominal xray - paucity of gas unchanged - SBO  Assessment/Plan: Status post Cesarean section complicated by SBO Replace K+ rec that pt be transferred to GI or surgical service for further treatment of SBO as symptoms unimproved Will await GI recs this morning    Sandra Love 03/28/2016, 8:50 AM

## 2016-03-29 ENCOUNTER — Inpatient Hospital Stay (HOSPITAL_COMMUNITY): Payer: Self-pay

## 2016-03-29 LAB — COMPREHENSIVE METABOLIC PANEL
ALT: 18 U/L (ref 14–54)
AST: 19 U/L (ref 15–41)
Albumin: 2.5 g/dL — ABNORMAL LOW (ref 3.5–5.0)
Alkaline Phosphatase: 63 U/L (ref 38–126)
Anion gap: 7 (ref 5–15)
BUN: 27 mg/dL — ABNORMAL HIGH (ref 6–20)
CHLORIDE: 107 mmol/L (ref 101–111)
CO2: 23 mmol/L (ref 22–32)
Calcium: 7.8 mg/dL — ABNORMAL LOW (ref 8.9–10.3)
Creatinine, Ser: 0.41 mg/dL — ABNORMAL LOW (ref 0.44–1.00)
Glucose, Bld: 145 mg/dL — ABNORMAL HIGH (ref 65–99)
Potassium: 4 mmol/L (ref 3.5–5.1)
Sodium: 137 mmol/L (ref 135–145)
TOTAL PROTEIN: 6.1 g/dL — AB (ref 6.5–8.1)
Total Bilirubin: 0.7 mg/dL (ref 0.3–1.2)

## 2016-03-29 LAB — MAGNESIUM: MAGNESIUM: 1.8 mg/dL (ref 1.7–2.4)

## 2016-03-29 LAB — CBC WITH DIFFERENTIAL/PLATELET
BASOS ABS: 0 10*3/uL (ref 0.0–0.1)
BASOS PCT: 0 %
EOS ABS: 0 10*3/uL (ref 0.0–0.7)
EOS PCT: 0 %
HCT: 32.1 % — ABNORMAL LOW (ref 36.0–46.0)
Hemoglobin: 10.5 g/dL — ABNORMAL LOW (ref 12.0–15.0)
Lymphocytes Relative: 10 %
Lymphs Abs: 1.1 10*3/uL (ref 0.7–4.0)
MCH: 26.4 pg (ref 26.0–34.0)
MCHC: 32.7 g/dL (ref 30.0–36.0)
MCV: 80.7 fL (ref 78.0–100.0)
Monocytes Absolute: 0.3 10*3/uL (ref 0.1–1.0)
Monocytes Relative: 2 %
Neutro Abs: 10.5 10*3/uL — ABNORMAL HIGH (ref 1.7–7.7)
Neutrophils Relative %: 88 %
PLATELETS: 415 10*3/uL — AB (ref 150–400)
RBC: 3.98 MIL/uL (ref 3.87–5.11)
RDW: 14.1 % (ref 11.5–15.5)
WBC: 11.9 10*3/uL — AB (ref 4.0–10.5)

## 2016-03-29 MED ORDER — HYDROMORPHONE HCL 1 MG/ML IJ SOLN: 0.2000 mg | INTRAMUSCULAR | Status: DC | PRN

## 2016-03-29 NOTE — Progress Notes (Signed)
[facility]. [facility]:  [phone] General Surgery Progress Note   LOS: 7 days  POD -  7 Days Post-Op  Assessment/Plan: 1. SBO  WBC - 11,900 - 03/29/2016  KUB pattern is a little better  She starting passing fluid out of her ostomy about 2 or 3 hours after being given steroids yesterday.  Whether the steroids were to cause or it was coincidental is unclear.    She does not like the NGT being replaced.  So will start liquids with NGT in place.  It she tolerates this well - we'll advance diet tomorrow and possible home tomorrow.  Since she is improving, will not move her [facility].  No reason to repeat KUB at this time.  2.  Crohn's disease   h/o total colectomy and end ileostomy (initially RLQ and later moved to LLQ).  [facility] "[facility]", followed by Dr. Ewing SchleinMagod - but does not see him regularly and is on no Crohn's meds  3.  Incarcerated right lower quadrant hernia repaired by Dr. Michaell CowingGross 02/13/2012 - he used 15 x 20 cm physiomesh (primarily preperitoneal)  3.  CESAREAN SECTION - 03/22/2016 - M. Morris  She has a boy (named River) 4.   DVT prophylaxis - no chemoprophylaxis at this time 5.   Gallstones - discussed this finding with patient   Active Problems:   S/P cesarean section   Subjective:  She now has ileostomy output.  No nausea.  She wants to leave the NGT.  Her husband is at the bedside.  Objective:   Vitals:   03/28/16 1825 03/29/16 0612  BP: 135/64 122/63  Pulse: (!) 57 (!) 55  Resp: 18 20  Temp: 97.6 F (36.4 C) 98.2 F (36.8 C)     Intake/Output from previous day:  12/26 0701 - 12/27 0700 In: 3002.1 [I.V.:3002.1] Out: 4135 [Urine:2450; Emesis/NG output:435; Stool:1250]  Intake/Output this shift:  No intake/output data recorded.   Physical Exam:   General: WN WF who is alert and oriented.    HEENT: Normal. Pupils equal. .   Lungs: Clear   Abdomen: Less distended, no tenderness, BS present. Ostomy  LLQ.   Wound: Clean   Lab Results:     Recent Labs  03/28/16 0525 03/29/16 0506  WBC 9.5 11.9*  HGB 9.5* 10.5*  HCT 29.6* 32.1*  PLT 377 415*    BMET    Recent Labs  03/28/16 0525 03/29/16 0506  NA 138 137  K 3.2* 4.0  CL 104 107  CO2 23 23  GLUCOSE 69 145*  BUN 39* 27*  CREATININE 0.53 0.41*  CALCIUM 7.8* 7.8*    PT/INR  No results for input(s): LABPROT, INR in the last 72 hours.  ABG  No results for input(s): PHART, HCO3 in the last 72 hours.  Invalid input(s): PCO2, PO2   Studies/Results:  Dg Abd 1 View  Result Date: 03/28/2016 CLINICAL DATA:  Small bowel obstruction, prior C-section, history Crohn's disease EXAM: ABDOMEN - 1 VIEW COMPARISON:  03/27/2016 FINDINGS: Tip of nasogastric tube projects over descending duodenum. Dilated small bowel loops in the upper mid abdomen with paucity of colonic gas unchanged. No definite free intraperitoneal air. Lung bases clear. Calcified gallstones in RIGHT upper quadrant. IMPRESSION: Persistent dilated small bowel loops with paucity of colonic gas, question small bowel obstruction versus ileus. Electronically Signed   By: Ulyses SouthwardMark  Boles M.D.   On: 03/28/2016 08:10   Dg Abd 2 Views  Result Date: 03/29/2016  CLINICAL DATA:  Follow-up bowel obstruction. EXAM: ABDOMEN - 2 VIEW COMPARISON:  Abdominal radiograph from 1 day prior FINDINGS: Enteric tube terminates in the descending duodenum. There are moderately dilated small bowel loops with air-fluid levels throughout the abdomen bilaterally measuring up to 5 cm diameter, not appreciably changed. Patient is status post total colectomy. No evidence of pneumatosis or pneumoperitoneum. Cholelithiasis is again demonstrated in the right abdomen. Clear lung bases. IMPRESSION: 1. Stable moderately dilated small bowel loops with air-fluid levels throughout the abdomen bilaterally, which could represent distal small bowel obstruction or adynamic ileus. 2. Cholelithiasis. Electronically Signed    By: Delbert Phenix M.D.   On: 03/29/2016 08:10     Anti-infectives:   Anti-infectives    Start     Dose/Rate Route Frequency Ordered Stop   03/22/16 0600  gentamicin (GARAMYCIN) 300 mg, clindamycin (CLEOCIN) 900 mg in dextrose 5 % 100 mL IVPB     227 mL/hr over 30 Minutes Intravenous On call to O.R. 03/21/16 1214 03/22/16 1517      Ovidio Kin, MD, FACS Pager: [phone] [facility]: [phone] 03/29/2016

## 2016-03-29 NOTE — Lactation Note (Addendum)
Lactation Consultation Note  Patient Name: Sandra Love Date: 03/29/2016    Per nursing, Mom was pumping and dumping because of receiving Solu-Medrol 40mg  IV q8hrs. Mom had said that the surgeon had implied it may be necessary.  Per Sandra Fus Love's "Medications & Mother's Milk," it is an L2. Per Dr. Sheffield Love & the [employer]act[employer] [facility] app, there are suggestions of delaying feedings/milk expressions for 2-8, but with infusions up to 1gm. Mom thinks the IV Solu-Medrol will be d/c'd tomorrow.   I called & spoke w/Dr. Otila Love & informed her of the above. MD states that she doesn't think Mom needs to pump and dump. Mother was informed. I also made Mom aware that if she wanted to be cautious, she could wait 2-4 hours after an infusion before pumping. Mom plans to give her milk to the nurses to store since infant is no longer in the hospital.   Other: Mom is aware that corticosteroids can impact milk supply. Mom reports that her breasts feel full & she needs to pump, but that she felt somewhat engorged a couple of days ago & now she feels that she may be "losing" her milk somewhat. Mom has essential oils in her room (doTERRA). Mom knows that clary sage can be helpful for milk supply, but that peppermint oil can be detrimental.   Sandra Love [facility] 03/29/2016, 3:53 PM

## 2016-03-29 NOTE — Progress Notes (Signed)
Dr.Newman notified that patient requesting to have diet advanced, ordered to clamp NG tube at this time and continue with ice chips. Will continue to monitor.

## 2016-03-29 NOTE — Progress Notes (Signed)
Subjective: Postpartum Day 7: Cesarean Delivery Patient reports + flatus, + BM and no problems voiding.  Light lochia.  Patient was started on prednisone yesterday and at 4pm started having ostomy output; emptied ostomy 4-5 times.  She reports abdominal upset which she remembers experiencing with prednisone in the past.    Objective: Vital signs in last 24 hours: Temp:  [97.6 F (36.4 C)-98.2 F (36.8 C)] 98.2 F (36.8 C) (12/27 0612) Pulse Rate:  [55-57] 55 (12/27 0612) Resp:  [18-20] 20 (12/27 0612) BP: (122-135)/(63-64) 122/63 (12/27 0612) SpO2:  [97 %] 97 % (12/27 0612)  Physical Exam:  General: alert, cooperative and appears stated age Lochia: appropriate Uterine Fundus: firm Abdomen: soft, minimal distention.  +BS in all four quads. Incision: healing well, no significant drainage, no dehiscence DVT Evaluation: No evidence of DVT seen on physical exam. Negative Homan's sign. No cords or calf tenderness.   Recent Labs  03/28/16 0525 03/29/16 0506  HGB 9.5* 10.5*  HCT 29.6* 32.1*    Assessment/Plan: Status post Cesarean section. Doing well postoperatively.  Postop SBO vs Crohn's flare-improvement on prednisone.  Appreciate GI and Gen Surg management.  Films pending from this AM.  Adel Neyer 03/29/2016, 8:12 AM

## 2016-03-30 ENCOUNTER — Inpatient Hospital Stay (HOSPITAL_COMMUNITY): Payer: Self-pay

## 2016-03-30 ENCOUNTER — Encounter (HOSPITAL_COMMUNITY): Payer: Self-pay | Admitting: Surgery

## 2016-03-30 DIAGNOSIS — K56609 Unspecified intestinal obstruction, unspecified as to partial versus complete obstruction: Secondary | ICD-10-CM

## 2016-03-30 LAB — CBC
HCT: 33.8 % — ABNORMAL LOW (ref 36.0–46.0)
Hemoglobin: 11.2 g/dL — ABNORMAL LOW (ref 12.0–15.0)
MCH: 26.8 pg (ref 26.0–34.0)
MCHC: 33.1 g/dL (ref 30.0–36.0)
MCV: 80.9 fL (ref 78.0–100.0)
PLATELETS: 462 10*3/uL — AB (ref 150–400)
RBC: 4.18 MIL/uL (ref 3.87–5.11)
RDW: 14.2 % (ref 11.5–15.5)
WBC: 18.9 10*3/uL — AB (ref 4.0–10.5)

## 2016-03-30 MED ORDER — DIPHENHYDRAMINE HCL 50 MG/ML IJ SOLN
12.5000 mg | Freq: Four times a day (QID) | INTRAMUSCULAR | Status: DC | PRN
  Administered 2016-04-01 (×2): 12.5 mg via INTRAVENOUS
  Administered 2016-04-01 – 2016-04-02 (×2): 25 mg via INTRAVENOUS
  Filled 2016-03-30 (×4): qty 1

## 2016-03-30 MED ORDER — ALUM & MAG HYDROXIDE-SIMETH 200-200-20 MG/5ML PO SUSP
15.0000 mL | Freq: Four times a day (QID) | ORAL | Status: DC | PRN
  Filled 2016-03-30: qty 30

## 2016-03-30 MED ORDER — INSULIN ASPART 100 UNIT/ML ~~LOC~~ SOLN: 0.0000 [IU] | SUBCUTANEOUS | Status: DC

## 2016-03-30 MED ORDER — PANTOPRAZOLE SODIUM 40 MG IV SOLR
40.0000 mg | Freq: Two times a day (BID) | INTRAVENOUS | Status: DC
  Administered 2016-03-31 – 2016-04-03 (×8): 40 mg via INTRAVENOUS
  Filled 2016-03-30 (×9): qty 40

## 2016-03-30 MED ORDER — PROMETHAZINE HCL 25 MG/ML IJ SOLN
12.5000 mg | Freq: Four times a day (QID) | INTRAMUSCULAR | Status: DC | PRN
  Administered 2016-03-30: 12.5 mg via INTRAVENOUS
  Filled 2016-03-30: qty 1

## 2016-03-30 MED ORDER — LORAZEPAM 2 MG/ML IJ SOLN: 0.5000 mg | Freq: Three times a day (TID) | INTRAMUSCULAR | Status: DC | PRN

## 2016-03-30 MED ORDER — PHENOL 1.4 % MT LIQD: 2.0000 | OROMUCOSAL | Status: DC | PRN

## 2016-03-30 MED ORDER — SIMETHICONE 80 MG PO CHEW: 80.0000 mg | CHEWABLE_TABLET | ORAL | Status: DC | PRN

## 2016-03-30 MED ORDER — INSULIN ASPART 100 UNIT/ML ~~LOC~~ SOLN: 0.0000 [IU] | Freq: Four times a day (QID) | SUBCUTANEOUS | Status: DC

## 2016-03-30 MED ORDER — ALUM & MAG HYDROXIDE-SIMETH 200-200-20 MG/5ML PO SUSP: 30.0000 mL | Freq: Four times a day (QID) | ORAL | Status: DC | PRN

## 2016-03-30 MED ORDER — METOCLOPRAMIDE HCL 5 MG/ML IJ SOLN: 5.0000 mg | Freq: Four times a day (QID) | INTRAMUSCULAR | Status: DC | PRN

## 2016-03-30 MED ORDER — MAGIC MOUTHWASH
15.0000 mL | Freq: Four times a day (QID) | ORAL | Status: DC | PRN
  Filled 2016-03-30: qty 15

## 2016-03-30 MED ORDER — LORATADINE 10 MG PO TABS
10.0000 mg | ORAL_TABLET | Freq: Every day | ORAL | Status: DC
  Administered 2016-03-31: 10 mg via ORAL
  Filled 2016-03-30 (×2): qty 1

## 2016-03-30 MED ORDER — DEXTROSE 5 % IV SOLN
1000.0000 mg | Freq: Four times a day (QID) | INTRAVENOUS | Status: DC | PRN
  Administered 2016-03-31 – 2016-04-02 (×3): 1000 mg via INTRAVENOUS
  Filled 2016-03-30 (×6): qty 10
  Filled 2016-03-30: qty 600

## 2016-03-30 MED ORDER — LIP MEDEX EX OINT
1.0000 "application " | TOPICAL_OINTMENT | Freq: Two times a day (BID) | CUTANEOUS | Status: DC
  Administered 2016-03-31 – 2016-04-03 (×6): 1 via TOPICAL
  Filled 2016-03-30 (×2): qty 7

## 2016-03-30 MED ORDER — FAT EMULSION 20 % IV EMUL
240.0000 mL | INTRAVENOUS | Status: AC
  Administered 2016-03-30: 240 mL via INTRAVENOUS
  Filled 2016-03-30: qty 250
  Filled 2016-03-30: qty 240

## 2016-03-30 MED ORDER — HYDROMORPHONE HCL 1 MG/ML IJ SOLN: 0.5000 mg | INTRAMUSCULAR | Status: DC | PRN

## 2016-03-30 MED ORDER — LACTATED RINGERS IV BOLUS (SEPSIS): 1000.0000 mL | Freq: Three times a day (TID) | INTRAVENOUS | Status: AC | PRN

## 2016-03-30 MED ORDER — POTASSIUM CHLORIDE 2 MEQ/ML IV SOLN
INTRAVENOUS | Status: DC
  Administered 2016-03-30: 21:00:00 via INTRAVENOUS
  Filled 2016-03-30: qty 1000

## 2016-03-30 MED ORDER — SODIUM CHLORIDE 0.9% FLUSH
10.0000 mL | INTRAVENOUS | Status: DC | PRN
  Filled 2016-03-30: qty 40

## 2016-03-30 MED ORDER — FAMOTIDINE IN NACL 20-0.9 MG/50ML-% IV SOLN
20.0000 mg | Freq: Two times a day (BID) | INTRAVENOUS | Status: DC
  Administered 2016-03-31 – 2016-04-03 (×8): 20 mg via INTRAVENOUS
  Filled 2016-03-30 (×8): qty 50

## 2016-03-30 MED ORDER — KETOROLAC TROMETHAMINE 30 MG/ML IJ SOLN
30.0000 mg | Freq: Four times a day (QID) | INTRAMUSCULAR | Status: DC | PRN
  Administered 2016-03-30 – 2016-04-03 (×11): 30 mg via INTRAVENOUS
  Filled 2016-03-30 (×12): qty 1

## 2016-03-30 MED ORDER — METOPROLOL TARTRATE 5 MG/5ML IV SOLN: 5.0000 mg | Freq: Four times a day (QID) | INTRAVENOUS | Status: DC | PRN

## 2016-03-30 MED ORDER — LACTATED RINGERS IV BOLUS (SEPSIS)
1000.0000 mL | Freq: Once | INTRAVENOUS | Status: AC
  Administered 2016-03-31: 1000 mL via INTRAVENOUS

## 2016-03-30 MED ORDER — PROCHLORPERAZINE EDISYLATE 5 MG/ML IJ SOLN: 5.0000 mg | INTRAMUSCULAR | Status: DC | PRN

## 2016-03-30 MED ORDER — TRACE MINERALS CR-CU-MN-SE-ZN 10-1000-500-60 MCG/ML IV SOLN
INTRAVENOUS | Status: AC
  Administered 2016-03-30: 20:00:00 via INTRAVENOUS
  Filled 2016-03-30: qty 960

## 2016-03-30 NOTE — Progress Notes (Signed)
PHARMACY - ADULT TOTAL PARENTERAL NUTRITION CONSULT NOTE   Pharmacy Consult for TPN Indication: SBO vs ileus s/p C-section, Hx Crohn's with ileostomy  Patient Measurements:  TBW: no post partum weight noted Height: 63in   There is no height or weight on file to calculate BMI.  Insulin Requirements:   Current Nutrition: CL diet   IVF: D5 LR with KCl 20 mEq/L  Central access: 12/28 TPN start date: 12/28  ASSESSMENT                                                                                                          HPI:  Pt s/p C-section 12/20 with SBO vs ileus, hx of Crohn's dz with colectomy/ileostomy.  Nausea decreased 12/27, clamped NG tube, and had ostomy outpt. Today 12/28 with worsening nausea, PICC placed, transfer to [facility] for TPN - Methylprednisolone 40mg  q8h from 12/26: increased serum glucose  Significant events:   Today:    Glucose - serum glucose 145  Electrolytes -wnl, Corr Ca slightly low  Renal - wnl  LFTs - wnl  TGs -pending  Prealbumin - pending  NUTRITIONAL GOALS                                                                                             RD recs: pending Clinimix 5/15 at a goal rate of 70 ml/hr + 20% fat emulsion at 10 ml/hr to provide: 84 g/day protein, 1700 Kcal/day.   <<<< Clinimix TPN Solution on [employer] Order, we are using Non- electrolyte formula >>>>  PLAN                                                                                                                         At 1800 today:  Start Clinimix 5/15 (no electrolyte formula) at 40 ml/hr.  20% fat emulsion at 10 ml/hr.  Plan to advance as tolerated to the goal rate.  TPN to contain standard multivitamins and trace elements.  Increase IVF to 6085ml/hr, Surgery desires total IV fluid rate 125 ml/hr  Add SSI - Novolog sensitive scale q6hr  TPN lab panels on Mondays & Thursdays.  Using non-electrolyte formula requires electrolyte  boluses, patient may  need more frequent labs  F/u daily.  Otho Bellows PharmD Pager [phone] 03/30/2016, 6:35 PM

## 2016-03-30 NOTE — Plan of Care (Signed)
Problem: Bowel/Gastric: Goal: Gastrointestinal status will improve Outcome: Progressing Stool output from ostomy increases, however pt is having nausea and vomiting

## 2016-03-30 NOTE — Progress Notes (Signed)
Pharmacy informed by RN that surgery request that IV fluid rate total be 150mL/h.  Justin Mend, RN

## 2016-03-30 NOTE — Lactation Note (Signed)
Lactation Consultation Note: [facility] She is not feeling well enough to pump today. She pumped last night. Mother has a Medela double electric pump at home. Husband states he will bring it to the hospital.  Mother denies having any concerns for Lactation. Support and encouragement given.   Patient Name: Gardiner Barefootshley E Perkin ONGEX'BToday's Date: 03/30/2016 Reason for consult: Follow-up assessment   Maternal Data    Feeding    LATCH Score/Interventions                      Lactation Tools Discussed/Used     Consult Status Consult Status: Complete    Michel BickersKendrick, Andreus Cure McCoy 03/30/2016, 12:05 PM

## 2016-03-30 NOTE — Progress Notes (Signed)
POD 8  S// feeling worse despite NG to LWS O//BP 123/72 (BP Location: Left Arm)   Pulse 63   Temp 98 F (36.7 C)   Resp 20   LMP 06/22/2015   SpO2 97%   Breastfeeding? Unknown    Intake/Output Summary (Last 24 hours) at 03/30/16 0845 Last data filed at 03/30/16 0250  Gross per 24 hour  Intake              735 ml  Output             2200 ml  Net            -1465 ml   A+P// still worrisome for SBO, will get GS to re-eval this AM

## 2016-03-30 NOTE — Progress Notes (Signed)
[facility]. [facility]:  [phone] General Surgery Progress Note   LOS: 8 days  POD -  8 Days Post-Op  Assessment/Plan: 1. SBO  WBC - 18,900 - 03/30/2016  Did well yesterday, but then has gone backwards since last PM.  Discussed with patient, husband, and Dr. Marcelle OverlieHolland - [facility] PICC line, start TPN, and re-evaluate   2.  Crohn's disease   h/o total colectomy and end ileostomy (initially RLQ and later moved to LLQ).  She's [facility] "[facility]", followed by Dr. Ewing SchleinMagod - but does not see him regularly and is on no Crohn's meds  3.  Incarcerated right lower quadrant hernia repaired by Dr. Michaell CowingGross 02/13/2012 - he used 15 x 20 cm physiomesh (primarily preperitoneal)  3.  CESAREAN SECTION - 03/22/2016 - M. Morris  She has a boy (named River) 4.   DVT prophylaxis - no chemoprophylaxis at this time 5.   Gallstones - discussed this finding with patient   Active Problems:   S/P cesarean section   Subjective:  More nausea.  The tube is bothering her.  Her ileostomy is still working, but less so.  She's not really having any abdominal pain.  Her husband is at the bedside.  Objective:   Vitals:   03/30/16 0250 03/30/16 0620  BP: 126/79 123/72  Pulse: 60 63  Resp:  20  Temp: 99.1 F (37.3 C) 98 F (36.7 C)     Intake/Output from previous day:  12/27 0701 - 12/28 0700 In: 735 [P.O.:360; I.V.:375] Out: 2200 [Urine:1350; Emesis/NG output:550; Stool:300]  Intake/Output this shift:  Total I/O In: -  Out: 850 [Urine:850]   Physical Exam:   General: WN WF who is alert and oriented.    HEENT: Normal. Pupils equal.  Has NGT. Marland Kitchen.   Lungs: Clear   Abdomen: Distention about the same as yesterday., no tenderness, BS present. Ostomy LLQ.   Wound: Clean   Lab Results:     Recent Labs  03/29/16 0506 03/30/16 0454  WBC 11.9* 18.9*  HGB 10.5* 11.2*  HCT 32.1* 33.8*  PLT 415* 462*    BMET    Recent Labs   03/28/16 0525 03/29/16 0506  NA 138 137  K 3.2* 4.0  CL 104 107  CO2 23 23  GLUCOSE 69 145*  BUN 39* 27*  CREATININE 0.53 0.41*  CALCIUM 7.8* 7.8*    PT/INR  No results for input(s): LABPROT, INR in the last 72 hours.  ABG  No results for input(s): PHART, HCO3 in the last 72 hours.  Invalid input(s): PCO2, PO2   Studies/Results:  Dg Abd 2 Views  Result Date: 03/29/2016 CLINICAL DATA:  Follow-up bowel obstruction. EXAM: ABDOMEN - 2 VIEW COMPARISON:  Abdominal radiograph from 1 day prior FINDINGS: Enteric tube terminates in the descending duodenum. There are moderately dilated small bowel loops with air-fluid levels throughout the abdomen bilaterally measuring up to 5 cm diameter, not appreciably changed. Patient is status post total colectomy. No evidence of pneumatosis or pneumoperitoneum. Cholelithiasis is again demonstrated in the right abdomen. Clear lung bases. IMPRESSION: 1. Stable moderately dilated small bowel loops with air-fluid levels throughout the abdomen bilaterally, which could represent distal small bowel obstruction or adynamic ileus. 2. Cholelithiasis. Electronically Signed   By: Delbert PhenixJason A Poff M.D.   On: 03/29/2016 08:10     Anti-infectives:   Anti-infectives    Start     Dose/Rate Route Frequency Ordered Stop   03/22/16  0600  gentamicin (GARAMYCIN) 300 mg, clindamycin (CLEOCIN) 900 mg in dextrose 5 % 100 mL IVPB     227 mL/hr over 30 Minutes Intravenous On call to O.R. 03/21/16 1214 03/22/16 1517      Ovidio Kin, MD, FACS Pager: [phone] [facility] [place] [facility]: [phone] 03/30/2016

## 2016-03-30 NOTE — Progress Notes (Signed)
Peripherally Inserted Central Catheter/Midline Placement  The IV Nurse has discussed with the patient and/or persons authorized to consent for the patient, the purpose of this procedure and the potential benefits and risks involved with this procedure.  The benefits include less needle sticks, lab draws from the catheter, and the patient may be discharged home with the catheter. Risks include, but not limited to, infection, bleeding, blood clot (thrombus formation), and puncture of an artery; nerve damage and irregular heartbeat and possibility to perform a PICC exchange if needed/ordered by physician.  Alternatives to this procedure were also discussed.  Bard Power PICC patient education guide, fact sheet on infection prevention and patient information card has been provided to patient /or left at bedside.    PICC/Midline Placement Documentation  PICC Double Lumen 03/30/16 PICC Right Basilic 39 cm 2 cm (Active)  Indication for Insertion or Continuance of Line Administration of hyperosmolar/irritating solutions (i.e. TPN, Vancomycin, etc.) 03/30/2016  3:00 PM  Exposed Catheter (cm) 2 cm 03/30/2016  3:00 PM  Dressing Change Due 04/06/16 03/30/2016  3:00 PM       Stacie Glaze Horton 03/30/2016, 3:59 PM

## 2016-03-30 NOTE — Progress Notes (Signed)
At 0615, went to give pt scheduled Solu-Medrol. Pt requested we delay medicine as she felt it contributed to her ongoing nausea/vomitting.

## 2016-03-31 ENCOUNTER — Inpatient Hospital Stay (HOSPITAL_COMMUNITY): Payer: Self-pay

## 2016-03-31 ENCOUNTER — Encounter (HOSPITAL_COMMUNITY): Payer: Self-pay | Admitting: Radiology

## 2016-03-31 LAB — COMPREHENSIVE METABOLIC PANEL
ALBUMIN: 2.7 g/dL — AB (ref 3.5–5.0)
ALK PHOS: 68 U/L (ref 38–126)
ALT: 20 U/L (ref 14–54)
AST: 18 U/L (ref 15–41)
Anion gap: 5 (ref 5–15)
BILIRUBIN TOTAL: 0.3 mg/dL (ref 0.3–1.2)
BUN: 13 mg/dL (ref 6–20)
CALCIUM: 7.8 mg/dL — AB (ref 8.9–10.3)
CO2: 28 mmol/L (ref 22–32)
Chloride: 102 mmol/L (ref 101–111)
Creatinine, Ser: 0.35 mg/dL — ABNORMAL LOW (ref 0.44–1.00)
GFR calc Af Amer: >60 mL/min (ref >60)
GFR calc non Af Amer: >60 mL/min (ref >60)
GLUCOSE: 111 mg/dL — AB (ref 65–99)
Potassium: 3.3 mmol/L — ABNORMAL LOW (ref 3.5–5.1)
Sodium: 135 mmol/L (ref 135–145)
TOTAL PROTEIN: 6.1 g/dL — AB (ref 6.5–8.1)

## 2016-03-31 LAB — GLUCOSE, CAPILLARY
GLUCOSE-CAPILLARY: 102 mg/dL — AB (ref 65–99)
GLUCOSE-CAPILLARY: 103 mg/dL — AB (ref 65–99)
GLUCOSE-CAPILLARY: 113 mg/dL — AB (ref 65–99)
GLUCOSE-CAPILLARY: 116 mg/dL — AB (ref 65–99)
Glucose-Capillary: 109 mg/dL — ABNORMAL HIGH (ref 65–99)

## 2016-03-31 LAB — CBC
HEMATOCRIT: 34 % — AB (ref 36.0–46.0)
HEMOGLOBIN: 11 g/dL — AB (ref 12.0–15.0)
MCH: 26.6 pg (ref 26.0–34.0)
MCHC: 32.4 g/dL (ref 30.0–36.0)
MCV: 82.3 fL (ref 78.0–100.0)
Platelets: 451 10*3/uL — ABNORMAL HIGH (ref 150–400)
RBC: 4.13 MIL/uL (ref 3.87–5.11)
RDW: 14.5 % (ref 11.5–15.5)
WBC: 13.5 10*3/uL — ABNORMAL HIGH (ref 4.0–10.5)

## 2016-03-31 LAB — PHOSPHORUS: Phosphorus: 2.3 mg/dL — ABNORMAL LOW (ref 2.5–4.6)

## 2016-03-31 LAB — DIFFERENTIAL
BASOS ABS: 0 10*3/uL (ref 0.0–0.1)
Basophils Relative: 0 %
EOS PCT: 2 %
Eosinophils Absolute: 0.3 10*3/uL (ref 0.0–0.7)
LYMPHS ABS: 1.3 10*3/uL (ref 0.7–4.0)
LYMPHS PCT: 9 %
MONOS PCT: 9 %
Monocytes Absolute: 1.3 10*3/uL — ABNORMAL HIGH (ref 0.1–1.0)
Neutro Abs: 10.7 10*3/uL — ABNORMAL HIGH (ref 1.7–7.7)
Neutrophils Relative %: 80 %

## 2016-03-31 LAB — TRIGLYCERIDES: Triglycerides: 227 mg/dL — ABNORMAL HIGH (ref <150)

## 2016-03-31 LAB — MAGNESIUM: Magnesium: 1.8 mg/dL (ref 1.7–2.4)

## 2016-03-31 LAB — PREALBUMIN: Prealbumin: 18.4 mg/dL (ref 18–38)

## 2016-03-31 MED ORDER — TRACE MINERALS CR-CU-MN-SE-ZN 10-1000-500-60 MCG/ML IV SOLN
INTRAVENOUS | Status: AC
  Administered 2016-03-31: 18:00:00 via INTRAVENOUS
  Filled 2016-03-31: qty 1200

## 2016-03-31 MED ORDER — IOPAMIDOL (ISOVUE-300) INJECTION 61%
INTRAVENOUS | Status: AC
  Administered 2016-03-31: 30 mL via ORAL
  Filled 2016-03-31: qty 30

## 2016-03-31 MED ORDER — IOPAMIDOL (ISOVUE-300) INJECTION 61%
30.0000 mL | Freq: Once | INTRAVENOUS | Status: AC
  Administered 2016-03-31: 30 mL via ORAL

## 2016-03-31 MED ORDER — DEXTROSE IN LACTATED RINGERS 5 % IV SOLN
INTRAVENOUS | Status: AC
  Administered 2016-03-31 – 2016-04-01 (×2): via INTRAVENOUS
  Filled 2016-03-31 (×3): qty 1000

## 2016-03-31 MED ORDER — IOPAMIDOL (ISOVUE-300) INJECTION 61%
INTRAVENOUS | Status: AC
  Filled 2016-03-31: qty 100

## 2016-03-31 MED ORDER — IOPAMIDOL (ISOVUE-300) INJECTION 61%
100.0000 mL | Freq: Once | INTRAVENOUS | Status: AC | PRN
  Administered 2016-03-31: 100 mL via INTRAVENOUS

## 2016-03-31 MED ORDER — POTASSIUM PHOSPHATES 15 MMOLE/5ML IV SOLN
10.0000 mmol | Freq: Once | INTRAVENOUS | Status: AC
  Administered 2016-03-31: 10 mmol via INTRAVENOUS
  Filled 2016-03-31: qty 3.33

## 2016-03-31 MED ORDER — FAT EMULSION 20 % IV EMUL
240.0000 mL | INTRAVENOUS | Status: AC
  Administered 2016-03-31: 240 mL via INTRAVENOUS
  Filled 2016-03-31: qty 250

## 2016-03-31 NOTE — Progress Notes (Signed)
PHARMACY - ADULT TOTAL PARENTERAL NUTRITION CONSULT NOTE   Pharmacy Consult for TPN Indication: SBO vs ileus s/p C-section, Hx Crohn's with ileostomy  Patient Measurements: Height: 5' 3.5" (161.3 cm) Weight: 156 lb 12 oz (71.1 kg) IBW/kg (Calculated) : 53.55 TPN AdjBW (KG): 57.9 Body mass index is 27.33 kg/m.  Insulin Requirements: none  Current Nutrition: CL diet   IVF: D5 LR with KCl 20 mEq/L at 85 ml/hr (total IVF + TPN at 125 ml/hr)  Central access: 12/28 TPN start date: 12/28  ASSESSMENT                                                                                                          HPI:  Pt s/p C-section 12/20 with SBO vs ileus, hx of Crohn's dz with colectomy/ileostomy.  Nausea decreased 12/27, clamped NG tube, and had ostomy outpt. Today 12/28 with worsening nausea, PICC placed, transfer to [facility] for TPN - Methylprednisolone 40mg  q8h from 12/26: increased serum glucose likely  Significant events:   Today:   Glucose - CBGs at goal  Electrolytes - K and phos low - will repoace  Renal - SCr low but stable  LFTs - wnl  TGs -pending  Prealbumin - pending  NUTRITIONAL GOALS                                                                                             RD recs: pending  Clinimix 5/15 at a goal rate of 70 ml/hr + 20% fat emulsion at 10 ml/hr to provide: 84 g/day protein, 1672 Kcal/day or Clinimix 5/20 at goal rate of 65 ml/hr + 20% fat emulsion at 10 mil/hr to provide: 78 g/day protein and 1891 kcal/day   <<<< Clinimix TPN Solution on [employer], product (i.e. eletrolytes vs no electroytes in TPN bags) may be subject to change on daily basis  >>>>  PLAN  10 mMol KPhos to correct low K and phos this AM                                                                                                                        At 1800 today:  Continue Clinimix  but change formula from 5/15 (no electrolyte formula) to E 5/20 and increase rate to  50 ml/hr.  20% fat emulsion at 10 ml/hr.  Plan to advance as tolerated to the goal rate.  TPN to contain standard multivitamins and trace elements.  Decrease IVF to 49ml/hr, Surgery desires total IV fluid rate 125 ml/hr  Contineu Novolog sensitive scale q6hr  TPN lab panels on Mondays & Thursdays.   Hessie Knows, PharmD, BCPS Pager [phone] 03/31/2016 7:56 AM

## 2016-03-31 NOTE — Progress Notes (Signed)
[facility] Progress Note  Subjective: Feeling OK, rough day yesterday  Objective: Vital signs in last 24 hours: Temp:  [98.2 F (36.8 C)-98.9 F (37.2 C)] 98.3 F (36.8 C) (12/29 0806) Pulse Rate:  [66-91] 91 (12/29 0806) Resp:  [16-19] 18 (12/29 0806) BP: (105-125)/(69-82) 125/79 (12/29 0806) SpO2:  [96 %-99 %] 98 % (12/29 0803) Weight:  [71.1 kg (156 lb 12 oz)] 71.1 kg (156 lb 12 oz) (12/28 1817) Weight change:    BM:[id number] semi-taut, 800 output in NG cannister Lab Results: Results for orders placed or performed during the hospital encounter of 03/22/16 (from the past 24 hour(s))  Glucose, capillary     Status: Abnormal   Collection Time: 03/31/16 12:25 AM  Result Value Ref Range   Glucose-Capillary 116 (H) 65 - 99 mg/dL  Comprehensive metabolic panel     Status: Abnormal   Collection Time: 03/31/16  4:55 AM  Result Value Ref Range   Sodium 135 135 - 145 mmol/L   Potassium 3.3 (L) 3.5 - 5.1 mmol/L   Chloride 102 101 - 111 mmol/L   CO2 28 22 - 32 mmol/L   Glucose, Bld 111 (H) 65 - 99 mg/dL   BUN 13 6 - 20 mg/dL   Creatinine, Ser 0.10 (L) 0.44 - 1.00 mg/dL   Calcium 7.8 (L) 8.9 - 10.3 mg/dL   Total Protein 6.1 (L) 6.5 - 8.1 g/dL   Albumin 2.7 (L) 3.5 - 5.0 g/dL   AST 18 15 - 41 U/L   ALT 20 14 - 54 U/L   Alkaline Phosphatase 68 38 - 126 U/L   Total Bilirubin 0.3 0.3 - 1.2 mg/dL   GFR calc non Af Amer >60 >60 mL/min   GFR calc Af Amer >60 >60 mL/min   Anion gap 5 5 - 15  Magnesium     Status: None   Collection Time: 03/31/16  4:55 AM  Result Value Ref Range   Magnesium 1.8 1.7 - 2.4 mg/dL  Phosphorus     Status: Abnormal   Collection Time: 03/31/16  4:55 AM  Result Value Ref Range   Phosphorus 2.3 (L) 2.5 - 4.6 mg/dL  CBC     Status: Abnormal   Collection Time: 03/31/16  4:55 AM  Result Value Ref Range   WBC 13.5 (H) 4.0 - 10.5 K/uL   RBC 4.13 3.87 - 5.11 MIL/uL   Hemoglobin 11.0 (L) 12.0 - 15.0 g/dL   HCT 27.2 (L) 53.6 - 64.4 %   MCV 82.3 78.0  - 100.0 fL   MCH 26.6 26.0 - 34.0 pg   MCHC 32.4 30.0 - 36.0 g/dL   RDW 03.4 74.2 - 59.5 %   Platelets 451 (H) 150 - 400 K/uL  Differential     Status: Abnormal   Collection Time: 03/31/16  4:55 AM  Result Value Ref Range   Neutrophils Relative % 80 %   Neutro Abs 10.7 (H) 1.7 - 7.7 K/uL   Lymphocytes Relative 9 %   Lymphs Abs 1.3 0.7 - 4.0 K/uL   Monocytes Relative 9 %   Monocytes Absolute 1.3 (H) 0.1 - 1.0 K/uL   Eosinophils Relative 2 %   Eosinophils Absolute 0.3 0.0 - 0.7 K/uL   Basophils Relative 0 %   Basophils Absolute 0.0 0.0 - 0.1 K/uL  Glucose, capillary     Status: Abnormal   Collection Time: 03/31/16  6:32 AM  Result Value Ref Range   Glucose-Capillary 113 (H) 65 - 99 mg/dL  Glucose,  capillary     Status: Abnormal   Collection Time: 03/31/16  7:37 AM  Result Value Ref Range   Glucose-Capillary 109 (H) 65 - 99 mg/dL    Studies/Results: Dg Chest Port 1 View  Result Date: 03/30/2016 CLINICAL DATA:  Status post insertion of decline. EXAM: PORTABLE CHEST 1 VIEW COMPARISON:  03/26/2016 FINDINGS: The nasogastric tube tip is below the field of view within the left upper quadrant of the abdomen and is likely within the stomach. Right arm PICC line tip is in the projection of the expected location of the cavoatrial junction. There is no pleural effusion or edema. No airspace opacity. IMPRESSION: Right arm PICC line tip is in the projection of the expected location of the cavoatrial junction. Electronically Signed   By: Signa Kellaylor  Stroud M.D.   On: 03/30/2016 16:15      Assessment: SBO following C-section, ? Crohn's vs postop/adhesions  Plan: Pt did not subjectively tolerate solumedrol which has been DC'ed. I agree at this point to repeat CT to assess for persistence vs improvement and better define nature (inflammatory vs non-inflammatory) of obstruction.    Madlynn Lundeen C 03/31/2016, 8:33 AM  Pager [phone] If no answer or after 5 PM call [phone]

## 2016-03-31 NOTE — Progress Notes (Signed)
Patient ID: Sandra Love, female   DOB: 1981/03/08, 35 y.o.   MRN: [id number]  [facility]  9 Days Post-Op  Subjective: Feeling a little better today. She reports less abdominal pain and bloating. Ileostomy with stool output. Denies any current n/v. Patient states that she stopped taking solu-medrol about 24 hours ago because she felt that it was making her sick.  Objective: Vital signs in last 24 hours: Temp:  [98.2 F (36.8 C)-98.9 F (37.2 C)] 98.3 F (36.8 C) (12/29 0806) Pulse Rate:  [66-91] 91 (12/29 0806) Resp:  [16-19] 18 (12/29 0806) BP: (105-125)/(69-82) 125/79 (12/29 0806) SpO2:  [96 %-99 %] 98 % (12/29 0803) Weight:  [156 lb 12 oz (71.1 kg)] 156 lb 12 oz (71.1 kg) (12/28 1817) Last BM Date: 03/31/16  Intake/Output from previous day: 12/28 0701 - 12/29 0700 In: 1792.1 [I.V.:742.1; IV Piggyback:1050] Out: 3800 [Urine:2700; Emesis/NG output:550; Stool:550] Intake/Output this shift: No intake/output data recorded.  PE: Gen:  Alert, NAD, pleasant Card:  RRR, no M/G/R heard Pulm:  CTAB, no W/R/R Abd: Soft, mild distension, hypoactive BS, nontender, Ileostomy LLQ with liquid stool in bag Ext:  No erythema, edema, or tenderness   Lab Results:   Recent Labs  03/30/16 0454 03/31/16 0455  WBC 18.9* 13.5*  HGB 11.2* 11.0*  HCT 33.8* 34.0*  PLT 462* 451*   BMET  Recent Labs  03/29/16 0506 03/31/16 0455  NA 137 135  K 4.0 3.3*  CL 107 102  CO2 23 28  GLUCOSE 145* 111*  BUN 27* 13  CREATININE 0.41* 0.35*  CALCIUM 7.8* 7.8*   PT/INR No results for input(s): LABPROT, INR in the last 72 hours. CMP     Component Value Date/Time   NA 135 03/31/2016 0455   K 3.3 (L) 03/31/2016 0455   CL 102 03/31/2016 0455   CO2 28 03/31/2016 0455   GLUCOSE 111 (H) 03/31/2016 0455   BUN 13 03/31/2016 0455   CREATININE 0.35 (L) 03/31/2016 0455   CALCIUM 7.8 (L) 03/31/2016 0455   PROT 6.1 (L) 03/31/2016 0455   ALBUMIN 2.7 (L) 03/31/2016 0455    AST 18 03/31/2016 0455   ALT 20 03/31/2016 0455   ALKPHOS 68 03/31/2016 0455   BILITOT 0.3 03/31/2016 0455   GFRNONAA >60 03/31/2016 0455   GFRAA >60 03/31/2016 0455   Lipase  No results found for: LIPASE     Studies/Results: Dg Chest Port 1 View  Result Date: 03/30/2016 CLINICAL DATA:  Status post insertion of decline. EXAM: PORTABLE CHEST 1 VIEW COMPARISON:  03/26/2016 FINDINGS: The nasogastric tube tip is below the field of view within the left upper quadrant of the abdomen and is likely within the stomach. Right arm PICC line tip is in the projection of the expected location of the cavoatrial junction. There is no pleural effusion or edema. No airspace opacity. IMPRESSION: Right arm PICC line tip is in the projection of the expected location of the cavoatrial junction. Electronically Signed   By: Signa Kellaylor  Stroud M.D.   On: 03/30/2016 16:15    Anti-infectives: Anti-infectives    Start     Dose/Rate Route Frequency Ordered Stop   03/22/16 0600  gentamicin (GARAMYCIN) 300 mg, clindamycin (CLEOCIN) 900 mg in dextrose 5 % 100 mL IVPB     227 mL/hr over 30 Minutes Intravenous On call to O.R. 03/21/16 1214 03/22/16 1517       Assessment/Plan SBO vs Active Crohn's disease - h/o subtotal colectomy and end ileostomy (  initially RLQ and later moved to LLQ) - Incarcerated right lower quadrant hernia repaired by Dr. Michaell Cowing 02/13/2012  - WBC trending down 13.5, afebrile - started solu-medrol 12/26, patient stopped them about 24 hours ago because she felt they were making her sick - ileostomy with more stool output today  S/p cesarean section 03/22/16 Dr. Langston Masker  FEN - IVF, clear liquids, TPN VTE - SCDs  Plan - Abdominal XR pending; may repeat CT scan depending on film. Ileostomy with more stool output today, patient with less subjective abdominal pain and distension. Encourage ambulation.   ADDENDUM: Xray reviewed. Persistent small bowel dilation. Ordering CT scan to further  evaluate.    LOS: 9 days    Edson Snowball , [facility] 03/31/2016, 8:37 AM Pager: [phone] Consults: [phone] Mon-Fri 7:00 am-4:30 pm Sat-Sun 7:00 am-11:30 am  Agree with above. Mixed picture regarding the bowel obstruction - she feels better, hiccups are gone, and her ostomy has had several loose stools today.  Her abdomen remains distended, but no localized tenderness.  Plan repeat abdominal CT scan with oral contrast today - she may no be able to get a lot of contrast down, but maybe a little will help identify the anatomy.  I talked to both husband and patient.  Ovidio Kin, MD, [facility] Pager: [phone] Office phone:  [phone]

## 2016-03-31 NOTE — Progress Notes (Addendum)
Initial Nutrition Assessment  DOCUMENTATION CODES:   Not applicable  INTERVENTION:   TPN - per pharmacy: currently:  Clinimix 5/15 at a goal rate of 70 ml/hr + 20% fat emulsion at 10 ml/hr to provide: 84 g/day protein, 1672 Kcal/day or Clinimix 5/20 at goal rate of 65 ml/hr + 20% fat emulsion at 10 mil/hr   Recommend thiamine '100mg'$  per bag since pt is at high risk for refeeding syndrome.   Recommend standard vitamins and minerals in TPN bag as pt is breastfeeding.   NUTRITION DIAGNOSIS:   Inadequate oral intake related to inability to eat, altered GI function as evidenced by other (see comment) (CL diet and SBO).  GOAL:   Patient will meet greater than or equal to 90% of their needs  MONITOR:   PO intake  REASON FOR ASSESSMENT:   Consult New TPN/TNA  ASSESSMENT:   35yo female PMH Crohns with h/o total colectomy and end ileostomy, s/p scheduled C-section 12/20 and now with possible SBO vs active Crohns disease.    Met with pt in room today. Pt reports good appetite prior to scheduled C-section. Pt reports that she did have a clear liquid diet for 2 days post op but then started developing N/V and stopped eating. Pt was without food for over 1 week prior to starting TPN. Pt is at risk for refeeding syndrome. Potassium and Phosphorus low today. Per pharmacy note "10 mMol KPhos to correct low K and phos this Am". Plan to recheck labs in the morning. Pt reports resolving N/V today. Pt on CL diet but hasn't eaten today r/t scheduled CT scan. Pt is pumping milk currently and planning to breastfeed at home. Will increase energy needs. NGT tube in place. Minimal production today. Expect ~13m/hr of normal gut secretions. Pt with h/o crohn's disease and w/ ileostomy from [address]. Pt reports increased production in bag today.  Medications reviewed and include: pepcid, insulin, solu-medrol, zofran, protonix, KCl  Labs reviewed: K 3.3(L), creat 0.35(L),Ca 7.8(L) adj. 8.84(L), Alb  2.7(L),  P 2.3(L) Wbc- 13.5(H)  Nutrition-Focused physical exam completed. Findings are no fat depletion, no muscle depletion, and no edema.   Diet Order:  TPN (CLINIMIX) Adult without lytes Diet clear liquid Room service appropriate? Yes; Fluid consistency: Thin; Fluid restriction: 1500 mL Fluid TPN (CLINIMIX-E) Adult  Skin:  Wound (see comment) (abdominal incision )  Last BM:  12/29-ileostomy   Height:   Ht Readings from Last 1 Encounters:  03/30/16 5' 3.5" (1.613 m)    Weight:   Wt Readings from Last 1 Encounters:  03/30/16 156 lb 12 oz (71.1 kg)    Ideal Body Weight:  53.4 kg  BMI:  Body mass index is 27.33 kg/m.  Estimated Nutritional Needs:   Kcal:  2500kcal/day   Protein:  78-92g/day   Fluid:  >2.5L/day   EDUCATION NEEDS:   No education needs identified at this time  CaKoleen DistanceRD, LDN Pager #- [phone]

## 2016-04-01 LAB — COMPREHENSIVE METABOLIC PANEL
ALBUMIN: 2.8 g/dL — AB (ref 3.5–5.0)
ALK PHOS: 68 U/L (ref 38–126)
ALT: 20 U/L (ref 14–54)
AST: 19 U/L (ref 15–41)
Anion gap: 6 (ref 5–15)
BILIRUBIN TOTAL: 0.5 mg/dL (ref 0.3–1.2)
BUN: 14 mg/dL (ref 6–20)
CALCIUM: 8 mg/dL — AB (ref 8.9–10.3)
CO2: 26 mmol/L (ref 22–32)
Chloride: 104 mmol/L (ref 101–111)
Creatinine, Ser: 0.34 mg/dL — ABNORMAL LOW (ref 0.44–1.00)
GFR calc Af Amer: >60 mL/min (ref >60)
GFR calc non Af Amer: >60 mL/min (ref >60)
GLUCOSE: 170 mg/dL — AB (ref 65–99)
Potassium: 3.4 mmol/L — ABNORMAL LOW (ref 3.5–5.1)
Sodium: 136 mmol/L (ref 135–145)
TOTAL PROTEIN: 5.9 g/dL — AB (ref 6.5–8.1)

## 2016-04-01 LAB — CBC
HEMATOCRIT: 31.3 % — AB (ref 36.0–46.0)
HEMOGLOBIN: 10.4 g/dL — AB (ref 12.0–15.0)
MCH: 27 pg (ref 26.0–34.0)
MCHC: 33.2 g/dL (ref 30.0–36.0)
MCV: 81.3 fL (ref 78.0–100.0)
Platelets: 404 10*3/uL — ABNORMAL HIGH (ref 150–400)
RBC: 3.85 MIL/uL — AB (ref 3.87–5.11)
RDW: 14.4 % (ref 11.5–15.5)
WBC: 10.5 10*3/uL (ref 4.0–10.5)

## 2016-04-01 LAB — GLUCOSE, CAPILLARY
GLUCOSE-CAPILLARY: 108 mg/dL — AB (ref 65–99)
Glucose-Capillary: 106 mg/dL — ABNORMAL HIGH (ref 65–99)
Glucose-Capillary: 96 mg/dL (ref 65–99)

## 2016-04-01 LAB — MAGNESIUM: Magnesium: 1.9 mg/dL (ref 1.7–2.4)

## 2016-04-01 LAB — PHOSPHORUS: Phosphorus: 3.5 mg/dL (ref 2.5–4.6)

## 2016-04-01 MED ORDER — KCL-LACTATED RINGERS-D5W 20 MEQ/L IV SOLN
INTRAVENOUS | Status: DC
  Administered 2016-04-02: 13:00:00 via INTRAVENOUS
  Filled 2016-04-01 (×2): qty 1000

## 2016-04-01 MED ORDER — POTASSIUM CHLORIDE 10 MEQ/100ML IV SOLN
10.0000 meq | INTRAVENOUS | Status: AC
  Administered 2016-04-01 (×4): 10 meq via INTRAVENOUS
  Filled 2016-04-01 (×4): qty 100

## 2016-04-01 MED ORDER — FAT EMULSION 20 % IV EMUL
240.0000 mL | INTRAVENOUS | Status: AC
  Administered 2016-04-01: 240 mL via INTRAVENOUS
  Filled 2016-04-01: qty 250

## 2016-04-01 MED ORDER — TRACE MINERALS CR-CU-MN-SE-ZN 10-1000-500-60 MCG/ML IV SOLN
INTRAVENOUS | Status: AC
  Administered 2016-04-01: 18:00:00 via INTRAVENOUS
  Filled 2016-04-01: qty 1560

## 2016-04-01 NOTE — Progress Notes (Signed)
PHARMACY - ADULT TOTAL PARENTERAL NUTRITION CONSULT NOTE   Pharmacy Consult for TPN Indication: SBO vs Crohn's exacerbation  Patient Measurements: Height: 5' 3.5" (161.3 cm) Weight: 156 lb 12 oz (71.1 kg) IBW/kg (Calculated) : 53.55 TPN AdjBW (KG): 57.9 Body mass index is 27.33 kg/m.  Insulin Requirements: none in past 24 hours  Current Nutrition: CL diet   IVF: D5 LR with KCl 20 mEq/L at 65 ml/hr (total IVF + TPN/lipids to equal 125 ml/hr)  Central access: 12/28 TPN start date: 12/28  ASSESSMENT                                                                                                          HPI:  35 y/o F s/p C-section 12/20 with SBO vs ileus, hx of Crohn's dz with colectomy/ileostomy.  Nausea decreased 12/27, clamped NG tube, and had ostomy outpt. Today 12/28 with worsening nausea, PICC placed, transfer to [facility] for TPN - Methylprednisolone 40mg  q8h from 12/26: increased serum glucose likely  Significant events:  12/30 Patient has been refusing most doses of SoluMedrol because she felt this medication was "making her sick".  Did receive dose yesterday AM.  GI note from yesterday states medication had been discontinued but it remains on profile - requesting order clarification.   Recent Labs  03/31/16 0455 04/01/16 0450  NA 135 136  K 3.3* 3.4*  CL 102 104  CO2 28 26  GLUCOSE 111* 170*  BUN 13 14  CREATININE 0.35* 0.34*  CALCIUM 7.8* 8.0*  PHOS 2.3* 3.5  MG 1.8 1.9  ALBUMIN 2.7* 2.8*  ALKPHOS 68 68  AST 18 19  ALT 20 20  BILITOT 0.3 0.5  TRIG 227*  --   PREALBUMIN 18.4  --   corrected Ca 9.0 (12/30)  Today:   Glucose - CBGs at goal (100-150); serum glucose elevated.  Remains on SSI sensitive scale q6h  Electrolytes - K improved but still low  Renal - SCr low but stable. BUN WNL  LFTs - all below ULN  TGs (12/29) slightly elevated but acceptable  Prealbumin (12/29) - WNL  NUTRITIONAL GOALS                                                                                              RD recs (12/29): 2500 KCal/d, 78 - 92 g protein/day, > 2.5 L fluid/day, add thiamine 100 mg/day to TPN  Clinimix-E 5/20 at a goal rate of 83 mL/hr + 20% fat emulsion at 10 ml/hr will provide: 2240 KCal/day, 100 g protein/d.    <<<< Because Clinimix TPN Solution is on [employer], product (i.e. electrolytes vs no electroytes in TPN bags) may be subject  to change on daily basis  >>>>  PLAN  KCl 10 mEq IV q1h x 4 At 1800 today:  Increase Clinimix-E 5/20 to 65 mL/hr.  Continue 20% fat emulsion at 10 ml/hr  Reduce maintenance IVF to 50 mL/hr (maintaining total IVF rate at 125 mL/hr)  TPN to contain multivitamins and trace elements.  Add additional thiamine 100 mg / day to TPN  Continue SSI Novolog sensitive scale q6h  BMet, Mg, Phos tomorrow AM  Routine TPN labs on Mondays and Thursdays  Elie Goodyandy Charan Prieto, PharmD, BCPS Pager: [phone] 04/01/2016  8:47 AM

## 2016-04-01 NOTE — Progress Notes (Signed)
Patient ID: Sandra Love, female   DOB: 11-02-1980, 35 y.o.   MRN: [id number] [facility] Surgery Progress Note:   10 Days Post-Op  Subjective: Mental status is clear.  NG clamped and she is having appropriate ileostomy output Objective: Vital signs in last 24 hours: Temp:  [98.1 F (36.7 C)-98.3 F (36.8 C)] 98.1 F (36.7 C) (12/30 0445) Pulse Rate:  [65-69] 65 (12/30 0445) Resp:  [18] 18 (12/30 0445) BP: (113-118)/(70-82) 118/82 (12/30 0445) SpO2:  [99 %] 99 % (12/29 1900)  Intake/Output from previous day: 12/29 0701 - 12/30 0700 In: -  Out: 2600 [Urine:2600] Intake/Output this shift: No intake/output data recorded.  Physical Exam: Work of breathing is not labored. NG present and clamped since yesterday.    Lab Results:  Results for orders placed or performed during the hospital encounter of 03/22/16 (from the past 48 hour(s))  Glucose, capillary     Status: Abnormal   Collection Time: 03/31/16 12:25 AM  Result Value Ref Range   Glucose-Capillary 116 (H) 65 - 99 mg/dL  Comprehensive metabolic panel     Status: Abnormal   Collection Time: 03/31/16  4:55 AM  Result Value Ref Range   Sodium 135 135 - 145 mmol/L   Potassium 3.3 (L) 3.5 - 5.1 mmol/L   Chloride 102 101 - 111 mmol/L   CO2 28 22 - 32 mmol/L   Glucose, Bld 111 (H) 65 - 99 mg/dL   BUN 13 6 - 20 mg/dL   Creatinine, Ser 0.35 (L) 0.44 - 1.00 mg/dL   Calcium 7.8 (L) 8.9 - 10.3 mg/dL   Total Protein 6.1 (L) 6.5 - 8.1 g/dL   Albumin 2.7 (L) 3.5 - 5.0 g/dL   AST 18 15 - 41 U/L   ALT 20 14 - 54 U/L   Alkaline Phosphatase 68 38 - 126 U/L   Total Bilirubin 0.3 0.3 - 1.2 mg/dL   GFR calc non Af Amer >60 >60 mL/min   GFR calc Af Amer >60 >60 mL/min    Comment: (NOTE) The eGFR has been calculated using the CKD EPI equation. This calculation has not been validated in all clinical situations. eGFR's persistently <60 mL/min signify possible Chronic Kidney Disease.    Anion gap 5 5 - 15  Prealbumin     Status:  None   Collection Time: 03/31/16  4:55 AM  Result Value Ref Range   Prealbumin 18.4 18 - 38 mg/dL    Comment: Performed at [facility]  Magnesium     Status: None   Collection Time: 03/31/16  4:55 AM  Result Value Ref Range   Magnesium 1.8 1.7 - 2.4 mg/dL  Phosphorus     Status: Abnormal   Collection Time: 03/31/16  4:55 AM  Result Value Ref Range   Phosphorus 2.3 (L) 2.5 - 4.6 mg/dL  Triglycerides     Status: Abnormal   Collection Time: 03/31/16  4:55 AM  Result Value Ref Range   Triglycerides 227 (H) <150 mg/dL    Comment: Performed at [facility]  CBC     Status: Abnormal   Collection Time: 03/31/16  4:55 AM  Result Value Ref Range   WBC 13.5 (H) 4.0 - 10.5 K/uL   RBC 4.13 3.87 - 5.11 MIL/uL   Hemoglobin 11.0 (L) 12.0 - 15.0 g/dL   HCT 34.0 (L) 36.0 - 46.0 %   MCV 82.3 78.0 - 100.0 fL   MCH 26.6 26.0 - 34.0 pg   MCHC 32.4  30.0 - 36.0 g/dL   RDW 14.5 11.5 - 15.5 %   Platelets 451 (H) 150 - 400 K/uL  Differential     Status: Abnormal   Collection Time: 03/31/16  4:55 AM  Result Value Ref Range   Neutrophils Relative % 80 %   Neutro Abs 10.7 (H) 1.7 - 7.7 K/uL   Lymphocytes Relative 9 %   Lymphs Abs 1.3 0.7 - 4.0 K/uL   Monocytes Relative 9 %   Monocytes Absolute 1.3 (H) 0.1 - 1.0 K/uL   Eosinophils Relative 2 %   Eosinophils Absolute 0.3 0.0 - 0.7 K/uL   Basophils Relative 0 %   Basophils Absolute 0.0 0.0 - 0.1 K/uL  Glucose, capillary     Status: Abnormal   Collection Time: 03/31/16  6:32 AM  Result Value Ref Range   Glucose-Capillary 113 (H) 65 - 99 mg/dL  Glucose, capillary     Status: Abnormal   Collection Time: 03/31/16  7:37 AM  Result Value Ref Range   Glucose-Capillary 109 (H) 65 - 99 mg/dL  Glucose, capillary     Status: Abnormal   Collection Time: 03/31/16  5:10 PM  Result Value Ref Range   Glucose-Capillary 103 (H) 65 - 99 mg/dL  Glucose, capillary     Status: Abnormal   Collection Time: 03/31/16 11:38 PM  Result Value Ref Range    Glucose-Capillary 102 (H) 65 - 99 mg/dL  CBC     Status: Abnormal   Collection Time: 04/01/16  4:50 AM  Result Value Ref Range   WBC 10.5 4.0 - 10.5 K/uL   RBC 3.85 (L) 3.87 - 5.11 MIL/uL   Hemoglobin 10.4 (L) 12.0 - 15.0 g/dL   HCT 31.3 (L) 36.0 - 46.0 %   MCV 81.3 78.0 - 100.0 fL   MCH 27.0 26.0 - 34.0 pg   MCHC 33.2 30.0 - 36.0 g/dL   RDW 14.4 11.5 - 15.5 %   Platelets 404 (H) 150 - 400 K/uL  Comprehensive metabolic panel     Status: Abnormal   Collection Time: 04/01/16  4:50 AM  Result Value Ref Range   Sodium 136 135 - 145 mmol/L   Potassium 3.4 (L) 3.5 - 5.1 mmol/L   Chloride 104 101 - 111 mmol/L   CO2 26 22 - 32 mmol/L   Glucose, Bld 170 (H) 65 - 99 mg/dL   BUN 14 6 - 20 mg/dL   Creatinine, Ser 0.34 (L) 0.44 - 1.00 mg/dL   Calcium 8.0 (L) 8.9 - 10.3 mg/dL   Total Protein 5.9 (L) 6.5 - 8.1 g/dL   Albumin 2.8 (L) 3.5 - 5.0 g/dL   AST 19 15 - 41 U/L   ALT 20 14 - 54 U/L   Alkaline Phosphatase 68 38 - 126 U/L   Total Bilirubin 0.5 0.3 - 1.2 mg/dL   GFR calc non Af Amer >60 >60 mL/min   GFR calc Af Amer >60 >60 mL/min    Comment: (NOTE) The eGFR has been calculated using the CKD EPI equation. This calculation has not been validated in all clinical situations. eGFR's persistently <60 mL/min signify possible Chronic Kidney Disease.    Anion gap 6 5 - 15  Magnesium     Status: None   Collection Time: 04/01/16  4:50 AM  Result Value Ref Range   Magnesium 1.9 1.7 - 2.4 mg/dL  Phosphorus     Status: None   Collection Time: 04/01/16  4:50 AM  Result Value Ref Range  Phosphorus 3.5 2.5 - 4.6 mg/dL  Glucose, capillary     Status: Abnormal   Collection Time: 04/01/16  4:58 AM  Result Value Ref Range   Glucose-Capillary 106 (H) 65 - 99 mg/dL    Radiology/Results: Ct Abdomen Pelvis W Contrast  Result Date: 03/31/2016 CLINICAL DATA:  35 year old female inpatient with history of Crohn disease with total colectomy in 2001 and left lower quadrant end ileostomy, status post  Cesarean delivery 03/22/2016, presenting for follow-up of small-bowel obstruction. EXAM: CT ABDOMEN AND PELVIS WITH CONTRAST TECHNIQUE: Multidetector CT imaging of the abdomen and pelvis was performed using the standard protocol following bolus administration of intravenous contrast. CONTRAST:  118m ISOVUE-300 IOPAMIDOL (ISOVUE-300) INJECTION 61% COMPARISON:  03/24/2016 CT abdomen/ pelvis. Abdominal radiographs from earlier today. FINDINGS: Lower chest: New trace left greater than right dependent basilar pleural effusions with minimal basilar lower lobe atelectasis bilaterally. Hepatobiliary: Normal liver with no liver mass. Cholelithiasis, with no definite gallbladder wall thickening or pericholecystic fluid. No biliary ductal dilatation. Pancreas: Normal, with no mass or duct dilation. Spleen: Normal size. No mass. Adrenals/Urinary Tract: Normal adrenals. Normal kidneys with no hydronephrosis and no renal mass. Normal bladder. Ureters are normal caliber. No evidence of ureteral leak on delayed postcontrast images of the pelvis. Stomach/Bowel: Enteric tube traverses the collapsed stomach and terminates in the descending duodenum. No appreciable gastric wall thickening. Patient is status post total colectomy with collapsed and grossly normal blind-ending Hartmann's pouch. There is diffuse dilatation of the mid to distal small bowel up to 5.2 cm diameter with multiple air-fluid levels throughout the dilated small bowel loops, not appreciably changed since 03/24/2016 CT study. The end ileostomy is collapsed. There is the suggestion of a focal small bowel caliber transition in the distal ileum located in the left central abdomen (series 2/ image 48). No appreciable small bowel wall thickening or pneumatosis. No evidence of an obstructing mass. Vascular/Lymphatic: Normal caliber abdominal aorta. Patent portal, splenic, hepatic and renal veins. No pathologically enlarged lymph nodes in the abdomen or pelvis.  Reproductive: Uterus appears within normal postpartum limits, slightly decreased in size in the interval. No adnexal mass. No uterine gas. Other: No residual pneumoperitoneum. No ascites or focal fluid collections. Tiny residual foci of superficial gas in the ventral pelvic muscle wall, decreased. No superficial fluid collections. Musculoskeletal: No aggressive appearing focal osseous lesions. Mild thoracolumbar spondylosis. IMPRESSION: 1. Persistent moderate diffuse dilatation of the mid to distal small bowel with multiple small bowel air-fluid levels, not appreciably changed since 03/24/2016 CT study. End ileostomy is collapsed. Possible focal small bowel caliber transition in the distal ileum. Findings may represent a mechanical distal small bowel obstruction due to adhesions, although a diffuse adynamic ileus remains on the differential in the recent postoperative state. No findings to suggest active Crohn enteritis. 2. Resolved pneumoperitoneum. Near complete resolution of superficial postoperative ventral pelvic muscle wall gas. No fluid collections. 3. New trace pleural effusions. 4. Cholelithiasis. Electronically Signed   By: JIlona SorrelM.D.   On: 03/31/2016 15:22   Dg Chest Port 1 View  Result Date: 03/30/2016 CLINICAL DATA:  Status post insertion of decline. EXAM: PORTABLE CHEST 1 VIEW COMPARISON:  03/26/2016 FINDINGS: The nasogastric tube tip is below the field of view within the left upper quadrant of the abdomen and is likely within the stomach. Right arm PICC line tip is in the projection of the expected location of the cavoatrial junction. There is no pleural effusion or edema. No airspace opacity. IMPRESSION: Right arm PICC line  tip is in the projection of the expected location of the cavoatrial junction. Electronically Signed   By: Kerby Moors M.D.   On: 03/30/2016 16:15   Dg Abd Acute W/chest  Result Date: 03/31/2016 CLINICAL DATA:  Postop C-section 9 days ago, NG tube present EXAM:  DG ABDOMEN ACUTE W/ 1V CHEST COMPARISON:  Chest x-ray of 03/22/2016 and abdomen films of the same day FINDINGS: No active infiltrate or effusion is seen. A right PICC line is present with the tip overlying the lower SVC. NG tube extends into the stomach. The heart is within normal limits in size. Supine and erect views the abdomen show persistent dilated loops of small bowel with differential air-fluid levels. NG tube extends into the descending duodenum. This pattern is most consistent with either partial small bowel obstruction or ileus although no definite colonic bowel gas is seen. No free air is seen on the erect view. Multiple gallstones are present within the right abdomen. IMPRESSION: 1. Suspect partial small bowel obstruction. No change compared to the prior images of 03/29/2016. 2. NG tube extends to the descending duodenum. 3. No active lung disease. Right PICC line tip overlies the lower SVC. Electronically Signed   By: Ivar Drape M.D.   On: 03/31/2016 09:49    Anti-infectives: Anti-infectives    Start     Dose/Rate Route Frequency Ordered Stop   03/22/16 0600  gentamicin (GARAMYCIN) 300 mg, clindamycin (CLEOCIN) 900 mg in dextrose 5 % 100 mL IVPB     227 mL/hr over 30 Minutes Intravenous On call to O.R. 03/21/16 1214 03/22/16 1517      Assessment/Plan: Problem List: Patient Active Problem List   Diagnosis Date Noted  . SBO (small bowel obstruction) 03/30/2016  . S/P cesarean section 10/10/2013  . Abdominal pain, periumbilical - most likely muscle strain 10/31/2012  . Endometrioma in LLQ incisional hernia s/p excision 02/13/2012 02/27/2012  . Paresthesia RLQ abd wall - mild 02/27/2012  . Crohn's disease of colon s/p abdominal colectomy / ileostomy 02/13/2012  . 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 Anxiety     Will begin clear liquids with NG in place and clamped.   10 Days Post-Op    LOS: 10 days   Matt B. Hassell Done, MD, [facility] [phone]  beeper [phone]  04/01/2016 9:09 AM

## 2016-04-01 NOTE — Progress Notes (Signed)
[facility] Progress Note  Sandra Love 35 y.o. 1980/07/16  CC:  Small bowel obstruction   Subjective: Patient is doing better. NG clamped since last 2 days. Ostomy output is increasing. Abdominal pain resolved.  ROS : Negative for abdominal pain. Negative for vomiting   Objective: Vital signs in last 24 hours: Vitals:   03/31/16 1900 04/01/16 0445  BP: 113/70 118/82  Pulse: 69 65  Resp: 18 18  Temp: 98.3 F (36.8 C) 98.1 F (36.7 C)    Physical Exam:  General:  Alert, cooperative, no distress, Ambulating in the room . NG tube noted.   Head:  Normocephalic, without obvious abnormality, atraumatic  Eyes:  , EOM's intact,   Lungs:   Clear to auscultation bilaterally, respirations unlabored  Heart:  Regular rate and rhythm, S1, S2 normal  Abdomen:   Soft, Mild distended, nontender, bowel sounds present   Extremities: Extremities normal, atraumatic, no  edema  Pulses: 2+ and symmetric    Lab Results:  Recent Labs  03/31/16 0455 04/01/16 0450  NA 135 136  K 3.3* 3.4*  CL 102 104  CO2 28 26  GLUCOSE 111* 170*  BUN 13 14  CREATININE 0.35* 0.34*  CALCIUM 7.8* 8.0*  MG 1.8 1.9  PHOS 2.3* 3.5    Recent Labs  03/31/16 0455 04/01/16 0450  AST 18 19  ALT 20 20  ALKPHOS 68 68  BILITOT 0.3 0.5  PROT 6.1* 5.9*  ALBUMIN 2.7* 2.8*    Recent Labs  03/31/16 0455 04/01/16 0450  WBC 13.5* 10.5  NEUTROABS 10.7*  --   HGB 11.0* 10.4*  HCT 34.0* 31.3*  MCV 82.3 81.3  PLT 451* 404*   No results for input(s): LABPROT, INR in the last 72 hours.    Assessment/Plan: - Small bowel obstruction. Repeat CT scan showed possible mechanical obstruction secondary to adhesions vs adynamic ileus. No evidence of active Crohn's disease. - History of Crohn's disease status post total colectomy with ileostomy. - Recent C-section. - Nausea and vomiting. Improving.  Recommendations ----------------------- - Management per surgery. - Ostomy output is increasing.  Not on steroids secondary to stomach upset, nausea, vomiting. Only tried for a few dose. - Not much to offer from GI standpoint. GI will sign up. I'll [place] back if needed. - Follow-up in GI clinic after discharge.  Kathi Der MD, FACP 04/01/2016, 10:43 AM  Pager [phone]  If no answer or after 5 PM call [phone]

## 2016-04-02 LAB — BASIC METABOLIC PANEL
Anion gap: 7 (ref 5–15)
BUN: 14 mg/dL (ref 6–20)
CO2: 25 mmol/L (ref 22–32)
CREATININE: 0.34 mg/dL — AB (ref 0.44–1.00)
Calcium: 8.3 mg/dL — ABNORMAL LOW (ref 8.9–10.3)
Chloride: 106 mmol/L (ref 101–111)
Glucose, Bld: 106 mg/dL — ABNORMAL HIGH (ref 65–99)
POTASSIUM: 3.5 mmol/L (ref 3.5–5.1)
SODIUM: 138 mmol/L (ref 135–145)

## 2016-04-02 LAB — CBC
HCT: 31.7 % — ABNORMAL LOW (ref 36.0–46.0)
Hemoglobin: 10.4 g/dL — ABNORMAL LOW (ref 12.0–15.0)
MCH: 26.7 pg (ref 26.0–34.0)
MCHC: 32.8 g/dL (ref 30.0–36.0)
MCV: 81.3 fL (ref 78.0–100.0)
Platelets: 384 10*3/uL (ref 150–400)
RBC: 3.9 MIL/uL (ref 3.87–5.11)
RDW: 14.5 % (ref 11.5–15.5)
WBC: 11.3 10*3/uL — ABNORMAL HIGH (ref 4.0–10.5)

## 2016-04-02 LAB — GLUCOSE, CAPILLARY
GLUCOSE-CAPILLARY: 94 mg/dL (ref 65–99)
GLUCOSE-CAPILLARY: 87 mg/dL (ref 65–99)
GLUCOSE-CAPILLARY: 97 mg/dL (ref 65–99)
Glucose-Capillary: 100 mg/dL — ABNORMAL HIGH (ref 65–99)

## 2016-04-02 LAB — PHOSPHORUS: PHOSPHORUS: 4.1 mg/dL (ref 2.5–4.6)

## 2016-04-02 LAB — MAGNESIUM: MAGNESIUM: 2 mg/dL (ref 1.7–2.4)

## 2016-04-02 MED ORDER — FAT EMULSION 20 % IV EMUL
240.0000 mL | INTRAVENOUS | Status: DC
  Administered 2016-04-02: 240 mL via INTRAVENOUS
  Filled 2016-04-02: qty 240

## 2016-04-02 MED ORDER — TRACE MINERALS CR-CU-MN-SE-ZN 10-1000-500-60 MCG/ML IV SOLN
INTRAVENOUS | Status: DC
  Administered 2016-04-02: 19:00:00 via INTRAVENOUS
  Filled 2016-04-02: qty 1560

## 2016-04-02 MED ORDER — POTASSIUM CHLORIDE 10 MEQ/100ML IV SOLN
10.0000 meq | INTRAVENOUS | Status: AC
  Administered 2016-04-02 (×4): 10 meq via INTRAVENOUS
  Filled 2016-04-02 (×4): qty 100

## 2016-04-02 NOTE — Progress Notes (Signed)
Patient ID: Sandra Love, female   DOB: 1980/07/05, 35 y.o.   MRN: [id number] [facility] Surgery Progress Note:   11 Days Post-Op  Subjective: Mental status is alert;  She doesn't want her NG out until she is sure that she won't need it.   Objective: Vital signs in last 24 hours: Temp:  [97.4 F (36.3 C)-98.3 F (36.8 C)] 97.4 F (36.3 C) (12/31 0538) Pulse Rate:  [64-66] 66 (12/31 0538) Resp:  [17-18] 17 (12/31 0538) BP: (116-137)/(75-82) 116/77 (12/31 0538) SpO2:  [100 %] 100 % (12/31 0538)  Intake/Output from previous day: 12/30 0701 - 12/31 0700 In: 3493.6 [P.O.:960; I.V.:2323.6; IV Piggyback:210] Out: 1551 [Urine:1201; Stool:350] Intake/Output this shift: No intake/output data recorded.  Physical Exam: Work of breathing is normal.  NG clamped.  Has tolerated clear liquids  Lab Results:  Results for orders placed or performed during the hospital encounter of 03/22/16 (from the past 48 hour(s))  Glucose, capillary     Status: Abnormal   Collection Time: 03/31/16  5:10 PM  Result Value Ref Range   Glucose-Capillary 103 (H) 65 - 99 mg/dL  Glucose, capillary     Status: Abnormal   Collection Time: 03/31/16 11:38 PM  Result Value Ref Range   Glucose-Capillary 102 (H) 65 - 99 mg/dL  CBC     Status: Abnormal   Collection Time: 04/01/16  4:50 AM  Result Value Ref Range   WBC 10.5 4.0 - 10.5 K/uL   RBC 3.85 (L) 3.87 - 5.11 MIL/uL   Hemoglobin 10.4 (L) 12.0 - 15.0 g/dL   HCT 31.3 (L) 36.0 - 46.0 %   MCV 81.3 78.0 - 100.0 fL   MCH 27.0 26.0 - 34.0 pg   MCHC 33.2 30.0 - 36.0 g/dL   RDW 14.4 11.5 - 15.5 %   Platelets 404 (H) 150 - 400 K/uL  Comprehensive metabolic panel     Status: Abnormal   Collection Time: 04/01/16  4:50 AM  Result Value Ref Range   Sodium 136 135 - 145 mmol/L   Potassium 3.4 (L) 3.5 - 5.1 mmol/L   Chloride 104 101 - 111 mmol/L   CO2 26 22 - 32 mmol/L   Glucose, Bld 170 (H) 65 - 99 mg/dL   BUN 14 6 - 20 mg/dL   Creatinine, Ser 0.34 (L) 0.44 -  1.00 mg/dL   Calcium 8.0 (L) 8.9 - 10.3 mg/dL   Total Protein 5.9 (L) 6.5 - 8.1 g/dL   Albumin 2.8 (L) 3.5 - 5.0 g/dL   AST 19 15 - 41 U/L   ALT 20 14 - 54 U/L   Alkaline Phosphatase 68 38 - 126 U/L   Total Bilirubin 0.5 0.3 - 1.2 mg/dL   GFR calc non Af Amer >60 >60 mL/min   GFR calc Af Amer >60 >60 mL/min    Comment: (NOTE) The eGFR has been calculated using the CKD EPI equation. This calculation has not been validated in all clinical situations. eGFR's persistently <60 mL/min signify possible Chronic Kidney Disease.    Anion gap 6 5 - 15  Magnesium     Status: None   Collection Time: 04/01/16  4:50 AM  Result Value Ref Range   Magnesium 1.9 1.7 - 2.4 mg/dL  Phosphorus     Status: None   Collection Time: 04/01/16  4:50 AM  Result Value Ref Range   Phosphorus 3.5 2.5 - 4.6 mg/dL  Glucose, capillary     Status: Abnormal   Collection Time:  04/01/16  4:58 AM  Result Value Ref Range   Glucose-Capillary 106 (H) 65 - 99 mg/dL  Glucose, capillary     Status: Abnormal   Collection Time: 04/01/16 11:58 AM  Result Value Ref Range   Glucose-Capillary 108 (H) 65 - 99 mg/dL  Glucose, capillary     Status: None   Collection Time: 04/01/16  5:42 PM  Result Value Ref Range   Glucose-Capillary 96 65 - 99 mg/dL  CBC     Status: Abnormal   Collection Time: 04/02/16  4:12 AM  Result Value Ref Range   WBC 11.3 (H) 4.0 - 10.5 K/uL   RBC 3.90 3.87 - 5.11 MIL/uL   Hemoglobin 10.4 (L) 12.0 - 15.0 g/dL   HCT 31.7 (L) 36.0 - 46.0 %   MCV 81.3 78.0 - 100.0 fL   MCH 26.7 26.0 - 34.0 pg   MCHC 32.8 30.0 - 36.0 g/dL   RDW 14.5 11.5 - 15.5 %   Platelets 384 150 - 400 K/uL  Basic metabolic panel     Status: Abnormal   Collection Time: 04/02/16  4:12 AM  Result Value Ref Range   Sodium 138 135 - 145 mmol/L   Potassium 3.5 3.5 - 5.1 mmol/L   Chloride 106 101 - 111 mmol/L   CO2 25 22 - 32 mmol/L   Glucose, Bld 106 (H) 65 - 99 mg/dL   BUN 14 6 - 20 mg/dL   Creatinine, Ser 0.34 (L) 0.44 - 1.00  mg/dL   Calcium 8.3 (L) 8.9 - 10.3 mg/dL   GFR calc non Af Amer >60 >60 mL/min   GFR calc Af Amer >60 >60 mL/min    Comment: (NOTE) The eGFR has been calculated using the CKD EPI equation. This calculation has not been validated in all clinical situations. eGFR's persistently <60 mL/min signify possible Chronic Kidney Disease.    Anion gap 7 5 - 15  Magnesium     Status: None   Collection Time: 04/02/16  4:12 AM  Result Value Ref Range   Magnesium 2.0 1.7 - 2.4 mg/dL  Phosphorus     Status: None   Collection Time: 04/02/16  4:12 AM  Result Value Ref Range   Phosphorus 4.1 2.5 - 4.6 mg/dL  Glucose, capillary     Status: Abnormal   Collection Time: 04/02/16  5:28 AM  Result Value Ref Range   Glucose-Capillary 100 (H) 65 - 99 mg/dL    Radiology/Results: Ct Abdomen Pelvis W Contrast  Result Date: 03/31/2016 CLINICAL DATA:  35 year old female inpatient with history of Crohn disease with total colectomy in 2001 and left lower quadrant end ileostomy, status post Cesarean delivery 03/22/2016, presenting for follow-up of small-bowel obstruction. EXAM: CT ABDOMEN AND PELVIS WITH CONTRAST TECHNIQUE: Multidetector CT imaging of the abdomen and pelvis was performed using the standard protocol following bolus administration of intravenous contrast. CONTRAST:  16m ISOVUE-300 IOPAMIDOL (ISOVUE-300) INJECTION 61% COMPARISON:  03/24/2016 CT abdomen/ pelvis. Abdominal radiographs from earlier today. FINDINGS: Lower chest: New trace left greater than right dependent basilar pleural effusions with minimal basilar lower lobe atelectasis bilaterally. Hepatobiliary: Normal liver with no liver mass. Cholelithiasis, with no definite gallbladder wall thickening or pericholecystic fluid. No biliary ductal dilatation. Pancreas: Normal, with no mass or duct dilation. Spleen: Normal size. No mass. Adrenals/Urinary Tract: Normal adrenals. Normal kidneys with no hydronephrosis and no renal mass. Normal bladder.  Ureters are normal caliber. No evidence of ureteral leak on delayed postcontrast images of the pelvis. Stomach/Bowel: Enteric tube  traverses the collapsed stomach and terminates in the descending duodenum. No appreciable gastric wall thickening. Patient is status post total colectomy with collapsed and grossly normal blind-ending Hartmann's pouch. There is diffuse dilatation of the mid to distal small bowel up to 5.2 cm diameter with multiple air-fluid levels throughout the dilated small bowel loops, not appreciably changed since 03/24/2016 CT study. The end ileostomy is collapsed. There is the suggestion of a focal small bowel caliber transition in the distal ileum located in the left central abdomen (series 2/ image 48). No appreciable small bowel wall thickening or pneumatosis. No evidence of an obstructing mass. Vascular/Lymphatic: Normal caliber abdominal aorta. Patent portal, splenic, hepatic and renal veins. No pathologically enlarged lymph nodes in the abdomen or pelvis. Reproductive: Uterus appears within normal postpartum limits, slightly decreased in size in the interval. No adnexal mass. No uterine gas. Other: No residual pneumoperitoneum. No ascites or focal fluid collections. Tiny residual foci of superficial gas in the ventral pelvic muscle wall, decreased. No superficial fluid collections. Musculoskeletal: No aggressive appearing focal osseous lesions. Mild thoracolumbar spondylosis. IMPRESSION: 1. Persistent moderate diffuse dilatation of the mid to distal small bowel with multiple small bowel air-fluid levels, not appreciably changed since 03/24/2016 CT study. End ileostomy is collapsed. Possible focal small bowel caliber transition in the distal ileum. Findings may represent a mechanical distal small bowel obstruction due to adhesions, although a diffuse adynamic ileus remains on the differential in the recent postoperative state. No findings to suggest active Crohn enteritis. 2. Resolved  pneumoperitoneum. Near complete resolution of superficial postoperative ventral pelvic muscle wall gas. No fluid collections. 3. New trace pleural effusions. 4. Cholelithiasis. Electronically Signed   By: Ilona Sorrel M.D.   On: 03/31/2016 15:22   Dg Abd Acute W/chest  Result Date: 03/31/2016 CLINICAL DATA:  Postop C-section 9 days ago, NG tube present EXAM: DG ABDOMEN ACUTE W/ 1V CHEST COMPARISON:  Chest x-ray of 03/22/2016 and abdomen films of the same day FINDINGS: No active infiltrate or effusion is seen. A right PICC line is present with the tip overlying the lower SVC. NG tube extends into the stomach. The heart is within normal limits in size. Supine and erect views the abdomen show persistent dilated loops of small bowel with differential air-fluid levels. NG tube extends into the descending duodenum. This pattern is most consistent with either partial small bowel obstruction or ileus although no definite colonic bowel gas is seen. No free air is seen on the erect view. Multiple gallstones are present within the right abdomen. IMPRESSION: 1. Suspect partial small bowel obstruction. No change compared to the prior images of 03/29/2016. 2. NG tube extends to the descending duodenum. 3. No active lung disease. Right PICC line tip overlies the lower SVC. Electronically Signed   By: Ivar Drape M.D.   On: 03/31/2016 09:49    Anti-infectives: Anti-infectives    Start     Dose/Rate Route Frequency Ordered Stop   03/22/16 0600  gentamicin (GARAMYCIN) 300 mg, clindamycin (CLEOCIN) 900 mg in dextrose 5 % 100 mL IVPB     227 mL/hr over 30 Minutes Intravenous On call to O.R. 03/21/16 1214 03/22/16 1517      Assessment/Plan: Problem List: Patient Active Problem List   Diagnosis Date Noted  . SBO (small bowel obstruction) 03/30/2016  . S/P cesarean section 10/10/2013  . Abdominal pain, periumbilical - most likely muscle strain 10/31/2012  . Endometrioma in LLQ incisional hernia s/p excision  02/13/2012 02/27/2012  . Paresthesia RLQ abd  wall - mild 02/27/2012  . Crohn's disease of colon s/p abdominal colectomy / ileostomy 02/13/2012  . 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 Anxiety     Advance to full liquids.   11 Days Post-Op    LOS: 11 days   Matt B. Hassell Done, MD, [facility] [phone] beeper [phone]  04/02/2016 9:07 AM

## 2016-04-02 NOTE — Progress Notes (Signed)
PHARMACY - ADULT TOTAL PARENTERAL NUTRITION CONSULT NOTE   Pharmacy Consult for TPN Indication: SBO vs ileus  Patient Measurements: Height: 5' 3.5" (161.3 cm) Weight: 156 lb 12 oz (71.1 kg) IBW/kg (Calculated) : 53.55 TPN AdjBW (KG): 57.9 Body mass index is 27.33 kg/m.  Insulin Requirements: none in past 24 hours  Current Nutrition:  Clear liquids Clinimix-E 5/20 at 65 mL/hr + Fat Emulsion 20% at 10 mL/hr  IVF: D5 LR with KCl 20 mEq/L at 50 ml/hr (total IVF + TPN/lipids to equal 125 ml/hr)  Central access: 12/28 TPN start date: 12/28  ASSESSMENT                                                                                                          HPI:  35 y/o F s/p C-section 12/20 with SBO vs ileus, hx of Crohn's dz with colectomy/ileostomy.  Nausea decreased 12/27, clamped NG tube, and had ostomy outpt. Today 12/28 with worsening nausea, PICC placed, transferred to [facility] for TPN   Significant events:  12/30 Patient has been refusing most doses of SoluMedrol because she felt medication was worsening nausea and vomiting.  Last dose given 12/29.  GI note from 12/29 states medication had been discontinued but it remains on profile - requesting formal order clarification. 12/31 GI note from yesterday: no evidence of active Crohn's disease.  NG tube remains clamped but patient doesn't want it removed until certain she no longer needs it.  Ostomy output has been increasing.  Tolerated clear liquids yesterday; advancing to full liquids today.   Recent Labs  03/31/16 0455 04/01/16 0450 04/02/16 0412  NA 135 136 138  K 3.3* 3.4* 3.5  CL 102 104 106  CO2 28 26 25   GLUCOSE 111* 170* 106*  BUN 13 14 14   CREATININE 0.35* 0.34* 0.34*  CALCIUM 7.8* 8.0* 8.3*  PHOS 2.3* 3.5 4.1  MG 1.8 1.9 2.0  ALBUMIN 2.7* 2.8*  --   ALKPHOS 68 68  --   AST 18 19  --   ALT 20 20  --   BILITOT 0.3 0.5  --   TRIG 227*  --   --   PREALBUMIN 18.4  --   --   corrected Ca 9.3  Today:   Glucose -  CBGs at goal (100-150), not requiring SSI at present.  No hx DM.  Electrolytes - K at LLN after 4 runs KCl 10 mEq yesterday.  Others WNL.  Renal - SCr low but stable. BUN WNL  LFTs (12/29) - all below ULN  TGs (12/29) slightly elevated but acceptable  Prealbumin (12/29) - WNL  NUTRITIONAL GOALS  RD recs (12/29): 2500 KCal/d, 78 - 92 g protein/day, > 2.5 L fluid/day, add thiamine 100 mg/day to TPN  Clinimix-E 5/20 at a goal rate of 83 mL/hr + 20% fat emulsion at 10 ml/hr will provide: 2240 KCal/day, 100 g protein/d.    <<<< Because Clinimix TPN Solution is on [employer], product (i.e. electrolytes vs no electroytes in TPN bags) may be subject to change on daily basis  >>>>  PLAN  KCl 10 mEq IV q1h x 4  Advancing to full liquid diet today per orders from surgery. At 1800 today:  Continue Clinimix-E 5/20 at 65 mL/hr.  Continue 20% fat emulsion at 10 ml/hr  Continue maintenance IVF at 50 mL/hr (maintaining total IVF rate at 125 mL/hr)  TPN to contain multivitamins and trace elements + additional thiamine 100 mg / day  Continue SSI Novolog sensitive scale q6h  Routine TPN labs on Mondays and Thursdays - next due tomorrow.  Follow-up on tolerance of full liquid diet and anticipated duration of TPN.  Elie Goodyandy Faustino Luecke, PharmD, BCPS Pager: [phone] 04/02/2016  9:35 AM

## 2016-04-03 LAB — COMPREHENSIVE METABOLIC PANEL
ALBUMIN: 3.3 g/dL — AB (ref 3.5–5.0)
ALT: 120 U/L — ABNORMAL HIGH (ref 14–54)
ANION GAP: 8 (ref 5–15)
AST: 59 U/L — ABNORMAL HIGH (ref 15–41)
Alkaline Phosphatase: 136 U/L — ABNORMAL HIGH (ref 38–126)
BILIRUBIN TOTAL: 0.3 mg/dL (ref 0.3–1.2)
BUN: 15 mg/dL (ref 6–20)
CO2: 26 mmol/L (ref 22–32)
Calcium: 8.7 mg/dL — ABNORMAL LOW (ref 8.9–10.3)
Chloride: 102 mmol/L (ref 101–111)
Creatinine, Ser: 0.43 mg/dL — ABNORMAL LOW (ref 0.44–1.00)
GFR calc non Af Amer: >60 mL/min (ref >60)
GLUCOSE: 96 mg/dL (ref 65–99)
POTASSIUM: 4.2 mmol/L (ref 3.5–5.1)
Sodium: 136 mmol/L (ref 135–145)
TOTAL PROTEIN: 6.9 g/dL (ref 6.5–8.1)

## 2016-04-03 LAB — PREALBUMIN: PREALBUMIN: 25.2 mg/dL (ref 18–38)

## 2016-04-03 LAB — DIFFERENTIAL
BASOS ABS: 0 10*3/uL (ref 0.0–0.1)
BASOS PCT: 0 %
Eosinophils Absolute: 0.4 10*3/uL (ref 0.0–0.7)
Eosinophils Relative: 3 %
LYMPHS ABS: 2 10*3/uL (ref 0.7–4.0)
Lymphocytes Relative: 16 %
Monocytes Absolute: 0.7 10*3/uL (ref 0.1–1.0)
Monocytes Relative: 6 %
NEUTROS PCT: 75 %
Neutro Abs: 9.3 10*3/uL — ABNORMAL HIGH (ref 1.7–7.7)

## 2016-04-03 LAB — CBC
HCT: 35.8 % — ABNORMAL LOW (ref 36.0–46.0)
Hemoglobin: 11.5 g/dL — ABNORMAL LOW (ref 12.0–15.0)
MCH: 26.1 pg (ref 26.0–34.0)
MCHC: 32.1 g/dL (ref 30.0–36.0)
MCV: 81.4 fL (ref 78.0–100.0)
Platelets: 468 10*3/uL — ABNORMAL HIGH (ref 150–400)
RBC: 4.4 MIL/uL (ref 3.87–5.11)
RDW: 14.6 % (ref 11.5–15.5)
WBC: 12.3 10*3/uL — ABNORMAL HIGH (ref 4.0–10.5)

## 2016-04-03 LAB — PHOSPHORUS: PHOSPHORUS: 4.4 mg/dL (ref 2.5–4.6)

## 2016-04-03 LAB — MAGNESIUM: MAGNESIUM: 2.1 mg/dL (ref 1.7–2.4)

## 2016-04-03 LAB — GLUCOSE, CAPILLARY: GLUCOSE-CAPILLARY: 110 mg/dL — AB (ref 65–99)

## 2016-04-03 LAB — TRIGLYCERIDES: TRIGLYCERIDES: 162 mg/dL — AB (ref <150)

## 2016-04-03 NOTE — Progress Notes (Signed)
12 Days Post-Op  Subjective: Stool and air out in bag, tol fulls fine, wants to go home today  Objective: Vital signs in last 24 hours: Temp:  [97.8 F (36.6 C)-98.5 F (36.9 C)] 97.8 F (36.6 C) (01/01 0524) Pulse Rate:  [69-79] 69 (01/01 0524) Resp:  [17-18] 17 (01/01 0524) BP: (106-124)/(67-80) 124/67 (01/01 0524) SpO2:  [98 %-100 %] 98 % (01/01 0524) Last BM Date: 04/01/16  Intake/Output from previous day: 12/31 0701 - 01/01 0700 In: 1675 [I.V.:1505; IV Piggyback:170] Out: 6200 [Urine:4750; Stool:1450] Intake/Output this shift: No intake/output data recorded.  GI: soft stoma functional bs present nontender  Lab Results:   Recent Labs  04/02/16 0412 04/03/16 0538  WBC 11.3* 12.3*  HGB 10.4* 11.5*  HCT 31.7* 35.8*  PLT 384 468*   BMET  Recent Labs  04/02/16 0412 04/03/16 0538  NA 138 136  K 3.5 4.2  CL 106 102  CO2 25 26  GLUCOSE 106* 96  BUN 14 15  CREATININE 0.34* 0.43*  CALCIUM 8.3* 8.7*   PT/INR No results for input(s): LABPROT, INR in the last 72 hours. ABG No results for input(s): PHART, HCO3 in the last 72 hours.  Invalid input(s): PCO2, PO2  Studies/Results: No results found.  Anti-infectives: Anti-infectives    Start     Dose/Rate Route Frequency Ordered Stop   03/22/16 0600  gentamicin (GARAMYCIN) 300 mg, clindamycin (CLEOCIN) 900 mg in dextrose 5 % 100 mL IVPB     227 mL/hr over 30 Minutes Intravenous On call to O.R. 03/21/16 1214 03/22/16 1517      Assessment/Plan: Likely sbo, resolved  She is doing well today, wants to go home, I think that is reasonable  Will dc tna, home today on fulls with supplements as I discussed with her Always risk of recurrence but I think she can manage advancing diet and bms at home at this point as she is well versed on this  [facility] 04/03/2016

## 2016-04-03 NOTE — Discharge Instructions (Signed)
Small Bowel Obstruction °A small bowel obstruction is a blockage in the small bowel. The small bowel, which is also called the small intestine, is a long, slender tube that connects the stomach to the colon. When a person eats and drinks, food and fluids go from the stomach to the small bowel. This is where most of the nutrients in the food and fluids are absorbed. °A small bowel obstruction will prevent food and fluids from passing through the small bowel as they normally do during digestion. The small bowel can become partially or completely blocked. This can cause symptoms such as abdominal pain, vomiting, and bloating. If this condition is not treated, it can be dangerous because the small bowel could rupture. °What are the causes? °Common causes of this condition include: °· Scar tissue from previous surgery or radiation treatment. °· Recent surgery. This may cause the movements of the bowel to slow down and cause food to block the intestine. °· Hernias. °· Inflammatory bowel disease (colitis). °· Twisting of the bowel (volvulus). °· Tumors. °· A foreign body. °· Slipping of a part of the bowel into another part (intussusception). °What are the signs or symptoms? °Symptoms of this condition include: °· Abdominal pain. This may be dull cramps or sharp pain. It may occur in one area, or it may be present in the entire abdomen. Pain can range from mild to severe, depending on the degree of obstruction. °· Nausea and vomiting. Vomit may be greenish or a yellow bile color. °· Abdominal bloating. °· Constipation. °· Lack of passing gas. °· Frequent belching. °· Diarrhea. This may occur if the obstruction is partial and runny stool is able to leak around the obstruction. °How is this diagnosed? °This condition may be diagnosed based on a physical exam, medical history, and X-rays of the abdomen. You may also have other tests, such as a CT scan of the abdomen and pelvis. °How is this treated? °Treatment for this  condition depends on the cause and severity of the problem. Treatment options may include: °· Bed rest along with fluids and pain medicines that are given through an IV tube inserted into one of your veins. Sometimes, this is all that is needed for the obstruction to improve. °· Following a simple diet. In some cases, a clear liquid diet may be required for several days. This allows the bowel to rest. °· Placement of a small tube (nasogastric tube) into the stomach. When the bowel is blocked, it usually swells up like a balloon that is filled with air and fluids. The air and fluids may be removed by suction through the nasogastric tube. This can help with pain, discomfort, and nausea. It can also help the obstruction to clear up faster. °· Surgery. This may be required if other treatments do not work. Bowel obstruction from a hernia may require early surgery and can be an emergency procedure. Surgery may also be required for scar tissue that causes frequent or severe obstructions. °Follow these instructions at home: °· Get plenty of rest. °· Follow instructions from your health care provider about eating restrictions. You may need to avoid solid foods and consume only clear liquids until your condition improves. °· Take over-the-counter and prescription medicines only as told by your health care provider. °· Keep all follow-up visits as told by your health care provider. This is important. °Contact a health care provider if: °· You have a fever. °· You have chills. °Get help right away if: °· You have increased   pain or cramping. °· You vomit blood. °· You have uncontrolled vomiting or nausea. °· You cannot drink fluids because of vomiting or pain. °· You develop confusion. °· You begin feeling very dry or thirsty (dehydrated). °· You have severe bloating. °· You feel extremely weak or you faint. °This information is not intended to replace advice given to you by your health care provider. Make sure you discuss any  questions you have with your health care provider. °Document Released: 06/06/2005 Document Revised: 11/15/2015 Document Reviewed: 05/14/2014 °Elsevier Interactive Patient Education © 2017 [employer] ° °

## 2016-04-03 NOTE — Progress Notes (Signed)
Pharmacy - TPN  Assessment:  58 yoF with SBO on TPN following C-section delivery. SBO resolved today; planning for discharge. Surgery has d/c'd TPN orders as of 0912 this AM.  Plan: Spoke with RN Kiristen regarding TPN stop:  Will continue running TPN for now; change rate from 65 ml/hr to 40 ml/hr x 1 hr  IV team to take TPN down at 1100 this AM  Bernadene Person, PharmD, BCPS Pager: [phone] 04/03/2016, 9:48 AM

## 2016-04-05 NOTE — Discharge Summary (Signed)
[facility] Discharge Summary   Patient ID: Sandra Love MRN: [id number] DOB/AGE: Jan 07, 1981 36 y.o.  Admit date: 03/22/2016 Discharge date: 04/03/2016  Admitting Diagnosis: Small bowel obstruction  Discharge Diagnosis Patient Active Problem List   Diagnosis Date Noted  . SBO (small bowel obstruction) 03/30/2016  . S/P cesarean section 10/10/2013  . Abdominal pain, periumbilical - most likely muscle strain 10/31/2012  . Endometrioma in LLQ incisional hernia s/p excision 02/13/2012 02/27/2012  . Paresthesia RLQ abd wall - mild 02/27/2012  . Crohn's disease of colon s/p abdominal colectomy / ileostomy 02/13/2012  . 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 Anxiety     Consultants None  Imaging: CT abdomen pelvis w contrast 03/31/16: 1. Persistent moderate diffuse dilatation of the mid to distal small bowel with multiple small bowel air-fluid levels, not appreciably changed since 03/24/2016 CT study. End ileostomy is collapsed. Possible focal small bowel caliber transition in the distal ileum. Findings may represent a mechanical distal small bowel obstruction due to adhesions, although a diffuse adynamic ileus remains on the differential in the recent postoperative state. No findings to suggest active Crohn enteritis. 2. Resolved pneumoperitoneum. Near complete resolution of superficial postoperative ventral pelvic muscle wall gas. No fluid collections. 3. New trace pleural effusions. 4. Cholelithiasis.  CT abdomen pelvis w contrast 03/24/16: 1. Small amount of pneumoperitoneum and ascites, as well as intramuscular and subcutaneous gas in the anterior abdominal wall, strongly favored to all be related to recent C-section.  2. There is dilatation of the small bowel which measures up to 5.1 cm in diameter, with multiple air-fluid levels. While the possibility of bowel obstruction is not excluded, it is not strongly favored, particularly in light of the decompressed stomach and  proximal jejunum/duodenum. Rather, findings are favored to be related to active Crohn's disease given multiple segmental areas of mild narrowing, bowel wall thickening and hypervascularity in the mesenteric. 3. Cholelithiasis without evidence to suggest an acute cholecystitis at this time. 4. Probable 1-2 mm nonobstructive calculus in the interpolar collecting system of the right kidney.  Procedures Dr. Langston Masker (03/22/16) - Cesarean section  Hospital Course:  Sandra Love is a 36yo female PMH Crohns with h/o total colectomy and end ileostomy (initially RLQ and later moved to [facility]) who underwent cesarean section 03/22/16. Initially did well postop then POD2 she began to have nausea, vomiting, and decreased ileostomy output. XR and CT were concerning for SBO versus active Crohns disease. GI consulted and felt that SBO was more likely than active Crohns; initial treatment included NG suction, IV hydration with avoidance of narcotic analgesics. Sandra Love did not improve therefore a 48 hour trial of corticosteroids was tried in case this was active Crohns. The patient did start passing fluid out of her ostomy about 2-3 hours after being given steroids, but this may have just been coincidental. Due to stomach upset, nausea, and vomiting Sandra Love did not complete full course of steroids. She did not progress quickly therefore she was transferred from [facility] hospital to [facility] and PICC/TPN was started. Repeat CT 03/31/16 showed a persistent SBO and no evidence of active Crohn's disease. The following day she began to have appropriate ileostomy output, therefore NG tube was clamped and clear liquid diet started. Diet progressed as tolerated and NG tube was removed. On 04/03/16 Sandra Love continued to pass stool from ileostomy and she was tolerating a full liquid diet. She was felt stable to discharge home on full liquid diet with supplements.  Allergies as of 04/03/2016      Reactions   Penicillins Hives    Has patient had a PCN reaction causing immediate rash, facial/tongue/throat swelling, SOB or lightheadedness with hypotension: no Has patient had a PCN reaction causing severe rash involving mucus membranes or skin necrosis: yes Has patient had a PCN reaction that required hospitalization no Has patient had a PCN reaction occurring within the last 10 years: no If all of the above answers are "NO", then may proceed with Cephalosporin use.      Medication List    TAKE these medications   albuterol 108 (90 Base) MCG/ACT inhaler Commonly known as:  PROVENTIL HFA;VENTOLIN HFA Inhale 1-2 puffs into the lungs every 6 (six) hours as needed (for allergies).   cetirizine 10 MG tablet Commonly known as:  ZYRTEC Take 10 mg by mouth daily.   ibuprofen 600 MG tablet Commonly known as:  ADVIL,MOTRIN Take 1 tablet (600 mg total) by mouth every 6 (six) hours.   multivitamin with minerals Tabs tablet Take 1 tablet by mouth daily.   oxyCODONE-acetaminophen 5-325 MG tablet Commonly known as:  PERCOCET/ROXICET Take 1-2 tablets by mouth every 4 (four) hours as needed for severe pain (moderate - severe pain).   ranitidine 150 MG tablet Commonly known as:  ZANTAC Take 150 mg by mouth 2 (two) times daily.        Follow-up Information    [facility] Follow up today.   Specialty:  General Surgery Why:  as needed Contact information: [address] [place] [address] [phone]       MORRIS, MEGAN, DO. Call today.   Specialty:  Obstetrics and Gynecology Why:  for appt Contact information: [address], [address] [place] [address] [phone]           Signed: Edson Snowball, [facility] 04/05/2016, 12:15 PM Pager: [phone] Consults: [phone] Mon-Fri 7:00 am-4:30 pm Sat-Sun 7:00 am-11:30 am

## 2017-01-23 IMAGING — CR DG ABDOMEN 2V
2 series · 2 of 2 positions shown · non-contrast
Comparison: Abdominal radiograph from 1 day prior

CLINICAL DATA: Follow-up bowel obstruction.

EXAM:
ABDOMEN - 2 VIEW

[abdomen erect]
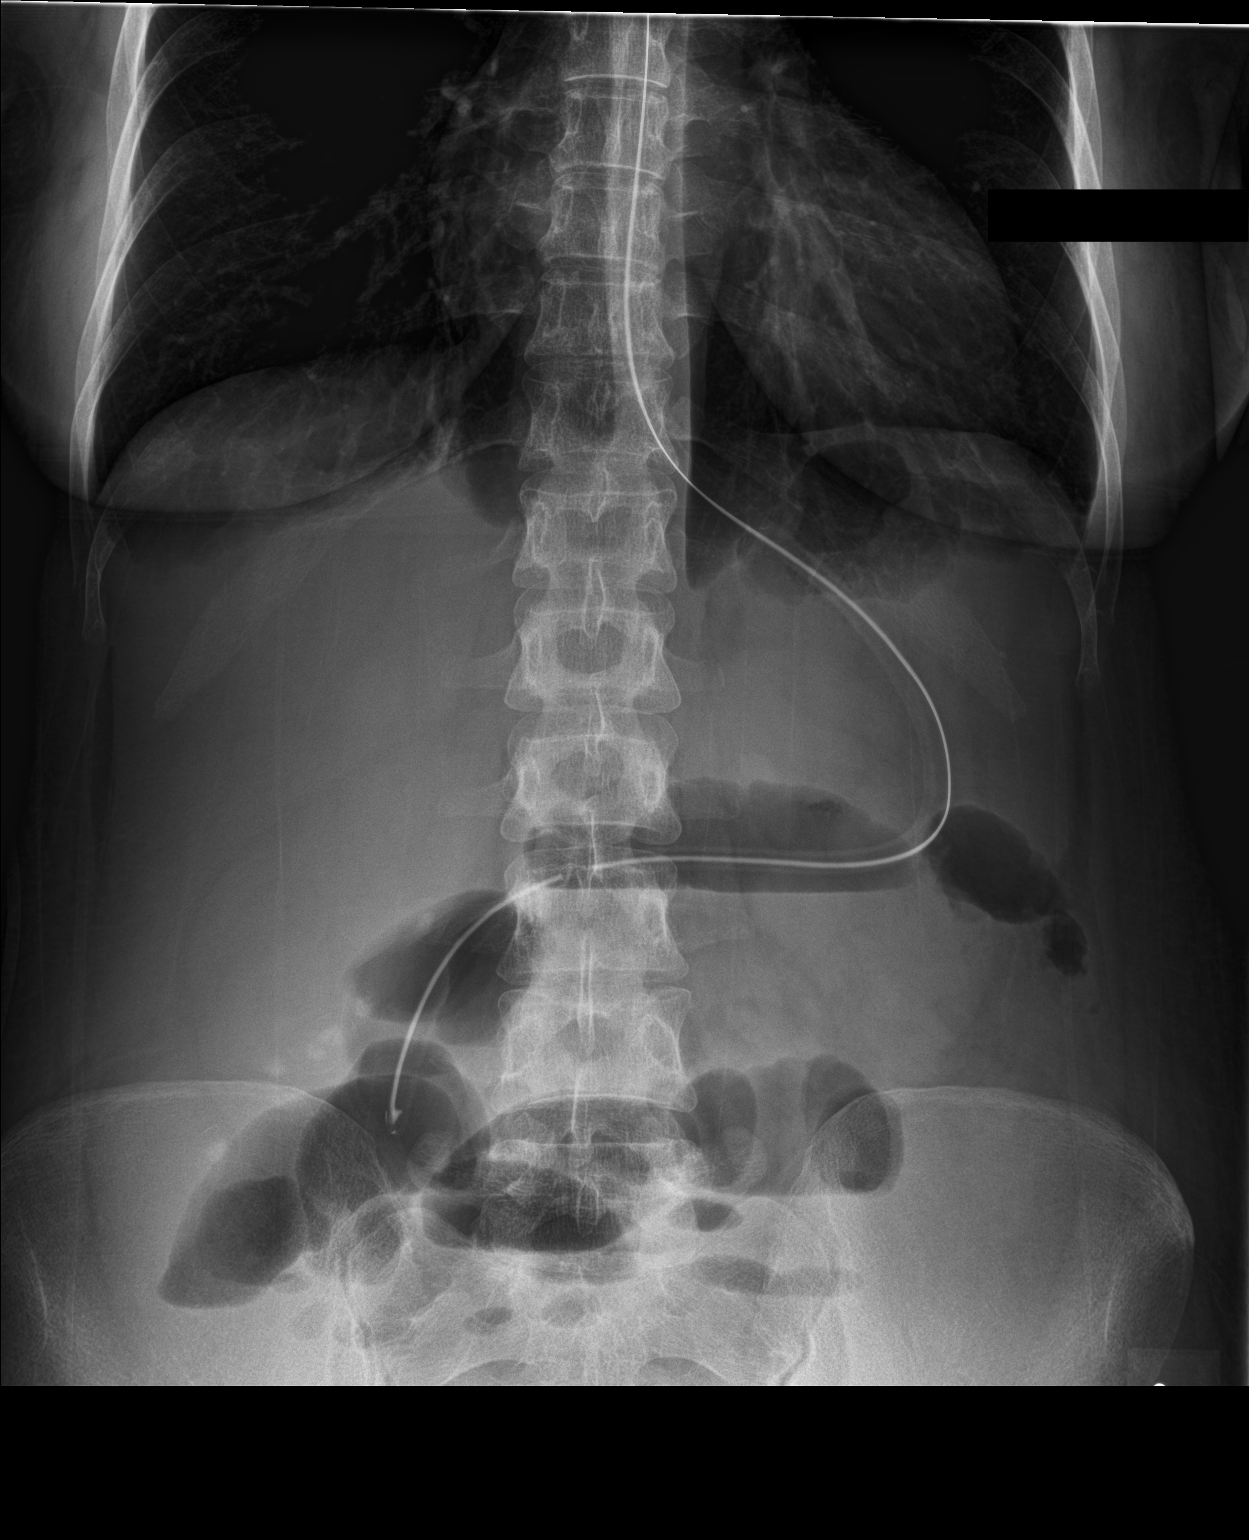

[abdomen supine]
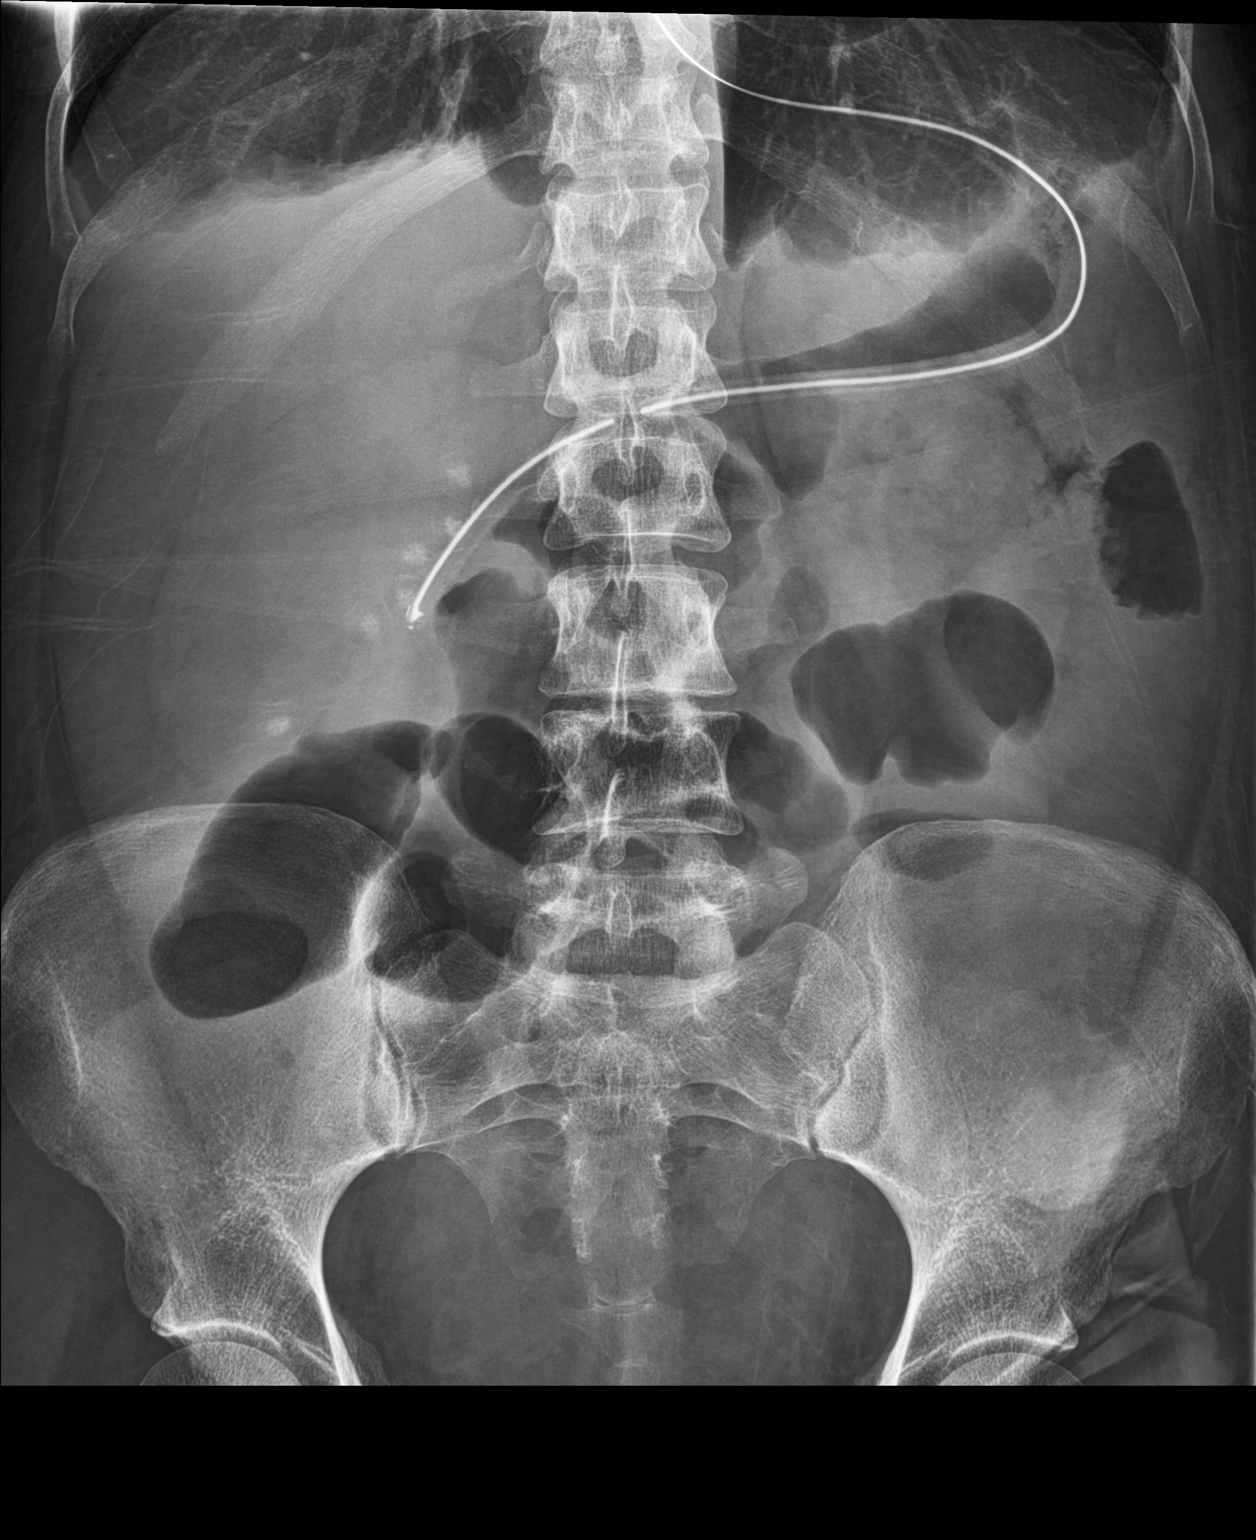

[2 of 2 positions shown; findings below may reference images not displayed]

FINDINGS: Enteric tube terminates in the descending duodenum. There are
moderately dilated small bowel loops with air-fluid levels
throughout the abdomen bilaterally measuring up to 5 cm diameter,
not appreciably changed. Patient is status post total colectomy. No
evidence of pneumatosis or pneumoperitoneum. Cholelithiasis is again
demonstrated in the right abdomen. Clear lung bases.
IMPRESSION: 1. Stable moderately dilated small bowel loops with air-fluid levels
throughout the abdomen bilaterally, which could represent distal
small bowel obstruction or adynamic ileus.
2. Cholelithiasis.

## 2024-01-16 ENCOUNTER — Other Ambulatory Visit (HOSPITAL_BASED_OUTPATIENT_CLINIC_OR_DEPARTMENT_OTHER): Payer: Self-pay | Admitting: Nurse Practitioner

## 2024-01-16 DIAGNOSIS — Z1231 Encounter for screening mammogram for malignant neoplasm of breast: Secondary | ICD-10-CM
